# Patient Record
Sex: Male | Born: 2000 | Race: Black or African American | Hispanic: No | Marital: Single | Smoking: Never smoker
Health system: Southern US, Community
[De-identification: ages and names within clinical notes are randomized; demographics above are authoritative.]

## PROBLEM LIST (undated history)

## (undated) DIAGNOSIS — H9190 Unspecified hearing loss, unspecified ear: Secondary | ICD-10-CM

## (undated) DIAGNOSIS — H547 Unspecified visual loss: Secondary | ICD-10-CM

## (undated) DIAGNOSIS — G809 Cerebral palsy, unspecified: Secondary | ICD-10-CM

## (undated) DIAGNOSIS — IMO0001 Reserved for inherently not codable concepts without codable children: Secondary | ICD-10-CM

## (undated) DIAGNOSIS — R6250 Unspecified lack of expected normal physiological development in childhood: Secondary | ICD-10-CM

## (undated) HISTORY — PX: EXTRACORPOREAL CIRCULATION: SHX266

---
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
## 2020-01-04 ENCOUNTER — Ambulatory Visit: Payer: Medicaid Other

## 2020-01-12 ENCOUNTER — Ambulatory Visit: Payer: Medicaid Other

## 2020-01-20 ENCOUNTER — Other Ambulatory Visit: Payer: Self-pay

## 2020-01-20 ENCOUNTER — Ambulatory Visit (INDEPENDENT_AMBULATORY_CARE_PROVIDER_SITE_OTHER): Payer: Medicaid Other

## 2020-01-20 DIAGNOSIS — Z23 Encounter for immunization: Secondary | ICD-10-CM

## 2020-01-20 NOTE — Progress Notes (Signed)
   Covid-19 Vaccination Clinic  Name:  Gary Warren    MRN: [id number] DOB: [birth year]  01/20/2020  [name] was observed post Covid-19 immunization for 15 minutes without incident. He was provided with Vaccine Information Sheet and instruction to access the V-Safe system.   [name] was instructed to call 911 with any severe reactions post vaccine: Marland Kitchen Difficulty breathing  . Swelling of face and throat  . A fast heartbeat  . A bad rash all over body  . Dizziness and weakness   #2 Covid Vaccine administered RD without complication.

## 2020-01-21 ENCOUNTER — Ambulatory Visit: Payer: Medicaid Other

## 2020-10-18 ENCOUNTER — Inpatient Hospital Stay (HOSPITAL_COMMUNITY)
Admission: EM | Admit: 2020-10-18 | Discharge: 2021-02-06 | DRG: 922 | Disposition: A | Discharge status: Skilled Nursing Facility | Payer: Medicaid Other | Attending: Internal Medicine | Admitting: Internal Medicine

## 2020-10-18 ENCOUNTER — Emergency Department (HOSPITAL_COMMUNITY): Payer: Medicaid Other

## 2020-10-18 ENCOUNTER — Other Ambulatory Visit: Payer: Self-pay

## 2020-10-18 DIAGNOSIS — I10 Essential (primary) hypertension: Secondary | ICD-10-CM | POA: Diagnosis present

## 2020-10-18 DIAGNOSIS — E872 Acidosis, unspecified: Secondary | ICD-10-CM | POA: Diagnosis not present

## 2020-10-18 DIAGNOSIS — Z20822 Contact with and (suspected) exposure to covid-19: Secondary | ICD-10-CM | POA: Diagnosis present

## 2020-10-18 DIAGNOSIS — Z681 Body mass index (BMI) 19 or less, adult: Secondary | ICD-10-CM

## 2020-10-18 DIAGNOSIS — L8990 Pressure ulcer of unspecified site, unspecified stage: Secondary | ICD-10-CM | POA: Diagnosis present

## 2020-10-18 DIAGNOSIS — L210 Seborrhea capitis: Secondary | ICD-10-CM | POA: Diagnosis present

## 2020-10-18 DIAGNOSIS — D538 Other specified nutritional anemias: Secondary | ICD-10-CM | POA: Diagnosis present

## 2020-10-18 DIAGNOSIS — L89322 Pressure ulcer of left buttock, stage 2: Secondary | ICD-10-CM | POA: Diagnosis present

## 2020-10-18 DIAGNOSIS — E876 Hypokalemia: Secondary | ICD-10-CM | POA: Diagnosis present

## 2020-10-18 DIAGNOSIS — I9589 Other hypotension: Secondary | ICD-10-CM | POA: Diagnosis not present

## 2020-10-18 DIAGNOSIS — R1310 Dysphagia, unspecified: Secondary | ICD-10-CM

## 2020-10-18 DIAGNOSIS — R627 Adult failure to thrive: Secondary | ICD-10-CM | POA: Diagnosis present

## 2020-10-18 DIAGNOSIS — L89222 Pressure ulcer of left hip, stage 2: Secondary | ICD-10-CM | POA: Diagnosis present

## 2020-10-18 DIAGNOSIS — R6250 Unspecified lack of expected normal physiological development in childhood: Secondary | ICD-10-CM | POA: Diagnosis present

## 2020-10-18 DIAGNOSIS — K5649 Other impaction of intestine: Secondary | ICD-10-CM | POA: Diagnosis present

## 2020-10-18 DIAGNOSIS — R140 Abdominal distension (gaseous): Secondary | ICD-10-CM

## 2020-10-18 DIAGNOSIS — R6520 Severe sepsis without septic shock: Secondary | ICD-10-CM | POA: Diagnosis not present

## 2020-10-18 DIAGNOSIS — R5381 Other malaise: Secondary | ICD-10-CM | POA: Diagnosis present

## 2020-10-18 DIAGNOSIS — A419 Sepsis, unspecified organism: Secondary | ICD-10-CM | POA: Diagnosis not present

## 2020-10-18 DIAGNOSIS — K567 Ileus, unspecified: Secondary | ICD-10-CM

## 2020-10-18 DIAGNOSIS — D638 Anemia in other chronic diseases classified elsewhere: Secondary | ICD-10-CM | POA: Diagnosis present

## 2020-10-18 DIAGNOSIS — R64 Cachexia: Secondary | ICD-10-CM

## 2020-10-18 DIAGNOSIS — F79 Unspecified intellectual disabilities: Secondary | ICD-10-CM | POA: Diagnosis present

## 2020-10-18 DIAGNOSIS — K625 Hemorrhage of anus and rectum: Secondary | ICD-10-CM | POA: Diagnosis present

## 2020-10-18 DIAGNOSIS — T7401XA Adult neglect or abandonment, confirmed, initial encounter: Principal | ICD-10-CM | POA: Diagnosis present

## 2020-10-18 DIAGNOSIS — E860 Dehydration: Secondary | ICD-10-CM | POA: Diagnosis present

## 2020-10-18 DIAGNOSIS — L89312 Pressure ulcer of right buttock, stage 2: Secondary | ICD-10-CM | POA: Diagnosis present

## 2020-10-18 DIAGNOSIS — G809 Cerebral palsy, unspecified: Secondary | ICD-10-CM | POA: Diagnosis present

## 2020-10-18 DIAGNOSIS — E43 Unspecified severe protein-calorie malnutrition: Secondary | ICD-10-CM | POA: Diagnosis present

## 2020-10-18 DIAGNOSIS — Z4659 Encounter for fitting and adjustment of other gastrointestinal appliance and device: Secondary | ICD-10-CM

## 2020-10-18 DIAGNOSIS — L89622 Pressure ulcer of left heel, stage 2: Secondary | ICD-10-CM | POA: Diagnosis present

## 2020-10-18 DIAGNOSIS — K5641 Fecal impaction: Secondary | ICD-10-CM | POA: Diagnosis present

## 2020-10-18 DIAGNOSIS — R634 Abnormal weight loss: Secondary | ICD-10-CM

## 2020-10-18 DIAGNOSIS — K5900 Constipation, unspecified: Secondary | ICD-10-CM

## 2020-10-18 DIAGNOSIS — Z515 Encounter for palliative care: Secondary | ICD-10-CM | POA: Diagnosis not present

## 2020-10-18 LAB — COMPREHENSIVE METABOLIC PANEL
ALT: 21 U/L (ref 0–44)
AST: 28 U/L (ref 15–41)
Albumin: 3.7 g/dL (ref 3.5–5.0)
Alkaline Phosphatase: 54 U/L (ref 38–126)
Anion gap: 10 (ref 5–15)
BUN: 9 mg/dL (ref 6–20)
CO2: 23 mmol/L (ref 22–32)
Calcium: 9.1 mg/dL (ref 8.9–10.3)
Chloride: 104 mmol/L (ref 98–111)
Creatinine, Ser: 0.33 mg/dL — ABNORMAL LOW (ref 0.61–1.24)
GFR, Estimated: >60 mL/min (ref >60)
Glucose, Bld: 97 mg/dL (ref 70–99)
Potassium: 4.4 mmol/L (ref 3.5–5.1)
Sodium: 137 mmol/L (ref 135–145)
Total Bilirubin: 0.3 mg/dL (ref 0.3–1.2)
Total Protein: 6.5 g/dL (ref 6.5–8.1)

## 2020-10-18 LAB — CBC WITH DIFFERENTIAL/PLATELET
Abs Immature Granulocytes: 0.02 10*3/uL (ref 0.00–0.07)
Basophils Absolute: 0 10*3/uL (ref 0.0–0.1)
Basophils Relative: 1 %
Eosinophils Absolute: 0 10*3/uL (ref 0.0–0.5)
Eosinophils Relative: 0 %
HCT: 36.6 % — ABNORMAL LOW (ref 39.0–52.0)
Hemoglobin: 12.2 g/dL — ABNORMAL LOW (ref 13.0–17.0)
Immature Granulocytes: 1 %
Lymphocytes Relative: 37 %
Lymphs Abs: 1.3 10*3/uL (ref 0.7–4.0)
MCH: 27.5 pg (ref 26.0–34.0)
MCHC: 33.3 g/dL (ref 30.0–36.0)
MCV: 82.4 fL (ref 80.0–100.0)
Monocytes Absolute: 0.2 10*3/uL (ref 0.1–1.0)
Monocytes Relative: 6 %
Neutro Abs: 2 10*3/uL (ref 1.7–7.7)
Neutrophils Relative %: 55 %
Platelets: 236 10*3/uL (ref 150–400)
RBC: 4.44 MIL/uL (ref 4.22–5.81)
RDW: 14.7 % (ref 11.5–15.5)
WBC: 3.6 10*3/uL — ABNORMAL LOW (ref 4.0–10.5)
nRBC: 0 % (ref 0.0–0.2)

## 2020-10-18 LAB — RESP PANEL BY RT-PCR (FLU A&B, COVID) ARPGX2
Influenza A by PCR: NEGATIVE
Influenza B by PCR: NEGATIVE
SARS Coronavirus 2 by RT PCR: NEGATIVE

## 2020-10-18 LAB — ETHANOL: Alcohol, Ethyl (B): <10 mg/dL (ref <10)

## 2020-10-18 MED ORDER — ACETAMINOPHEN 325 MG PO TABS
325.0000 mg | ORAL_TABLET | Freq: Four times a day (QID) | ORAL | Status: DC | PRN
  Administered 2020-11-30: 325 mg via ORAL
  Filled 2020-10-18 (×4): qty 1

## 2020-10-18 MED ORDER — IOHEXOL 350 MG/ML SOLN
40.0000 mL | Freq: Once | INTRAVENOUS | Status: AC | PRN
  Administered 2020-10-18: 40 mL via INTRAVENOUS

## 2020-10-18 MED ORDER — HEPARIN SODIUM (PORCINE) 5000 UNIT/ML IJ SOLN
5000.0000 [IU] | Freq: Two times a day (BID) | INTRAMUSCULAR | Status: DC
  Administered 2020-10-19 – 2020-11-05 (×35): 5000 [IU] via SUBCUTANEOUS
  Filled 2020-10-18 (×33): qty 1

## 2020-10-18 MED ORDER — BARRIER CREAM NON-SPECIFIED: 1.0000 "application " | TOPICAL_CREAM | Freq: Two times a day (BID) | TOPICAL | Status: DC | PRN

## 2020-10-18 MED ORDER — ONDANSETRON HCL 4 MG/2ML IJ SOLN: 4.0000 mg | Freq: Four times a day (QID) | INTRAMUSCULAR | Status: DC | PRN

## 2020-10-18 MED ORDER — SORBITOL 70 % SOLN
200.0000 mL | TOPICAL_OIL | Freq: Once | ORAL | Status: AC
  Administered 2020-10-18: 200 mL via RECTAL
  Filled 2020-10-18: qty 60

## 2020-10-18 MED ORDER — SORBITOL 70 % SOLN: 200.0000 mL | TOPICAL_OIL | Freq: Once | ORAL | Status: DC | PRN

## 2020-10-18 MED ORDER — ONDANSETRON HCL 4 MG PO TABS: 4.0000 mg | ORAL_TABLET | Freq: Four times a day (QID) | ORAL | Status: DC | PRN

## 2020-10-18 MED ORDER — KCL IN DEXTROSE-NACL 10-5-0.45 MEQ/L-%-% IV SOLN
INTRAVENOUS | Status: AC
  Filled 2020-10-18: qty 1000

## 2020-10-18 MED ORDER — ACETAMINOPHEN 650 MG RE SUPP
650.0000 mg | Freq: Four times a day (QID) | RECTAL | Status: DC | PRN
  Administered 2020-10-21: 650 mg via RECTAL
  Filled 2020-10-18: qty 1

## 2020-10-18 NOTE — ED Notes (Signed)
Received pt, alert, looking around the room, no acute distress noted. Appears underweight, cheekbone prominent. Non verbal but makes sounds which per EMS was normal for patient. Clean clothes on and clean shoes noted. Long nails with black/yellow dirt noted under nails.

## 2020-10-18 NOTE — ED Notes (Signed)
Family updated as to patient's status.
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
## 2020-10-18 NOTE — ED Notes (Signed)
Andreda mother [phone] would like an update on the patient
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
## 2020-10-18 NOTE — ED Provider Notes (Signed)
Care of the patient assumed at the change of shift. Patient with severe CP brought from [facility] center for concerns of significant weight loss in the last 6-8 months. CPS referral was made.  Physical Exam  BP 107/71   Pulse 88   Resp 16   Wt 22.5 kg   SpO2 100%   Physical Exam Cachectic Large hard stool ball, brown stools  ED Course/Procedures   Clinical Course as of 10/19/20 0833  Wed Oct 18, 2020  1434 I discussed the chest x-ray results with the radiologist who read the film.  He is concerned about free air in the abdomen.  He is also concerned about potential sigmoid colon dilation.  CT scan of the abdomen pelvis is ordered. [EW]  1509 [phone], Myrtis Ser. Adult Protective Services. [EW]  1532 There are 2 caseworkers in the room with the patient and they are concerned about his "emaciation and malnourishment."  They state that they do not have indication to remove him from the house yet, but would like to see him admitted for management of the malnutrition.  I explained that the evaluation was in process with CT imaging ordered.  At this point the available blood work is reassuring and does not indicate acute problems. [EW]  1654 Large hard stool ball, CT images reviewed, will discuss with GI [CS]  1756 Spoke with Dr. Rhea Belton, GI who recommends trying a SMOG enema to help soften the stool ball, admit to medicine and they will consult. Unassigned admission paged.  [CS]  1831 Spoke with Dr. Terrilee Croak, Hospitalist, who will evaluate for admission.  [CS]    Clinical Course User Index [CS] Pollyann Savoy, MD [EW] Mancel Bale, MD    Procedures  MDM         Pollyann Savoy, MD 10/19/20 [phone]

## 2020-10-18 NOTE — Progress Notes (Signed)
APS is present in the room. SW plans on going to the mothers job to make her aware of the report and her son being in the hospital. APS at this time does not feel comfortable with patient returning back to the mothers home. Patient teacher stated they have not seen him since last year and he was walking and eating on his own. CSW was told that they had to use a wheelchair for patient because he was too weak to walk. There were also concerns that he could not feed himself today at school.
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
## 2020-10-18 NOTE — ED Notes (Signed)
Secretary ordered enema kit. 

## 2020-10-18 NOTE — ED Notes (Signed)
Attempted to place male purewick for urine collection, unsuccessful. Diaper changed and repositioned.

## 2020-10-19 ENCOUNTER — Inpatient Hospital Stay (HOSPITAL_COMMUNITY): Payer: Medicaid Other

## 2020-10-19 DIAGNOSIS — L8990 Pressure ulcer of unspecified site, unspecified stage: Secondary | ICD-10-CM | POA: Diagnosis present

## 2020-10-19 DIAGNOSIS — K5649 Other impaction of intestine: Secondary | ICD-10-CM | POA: Diagnosis not present

## 2020-10-19 LAB — CBC
HCT: 46 % (ref 39.0–52.0)
Hemoglobin: 15.5 g/dL (ref 13.0–17.0)
MCH: 27 pg (ref 26.0–34.0)
MCHC: 33.7 g/dL (ref 30.0–36.0)
MCV: 80 fL (ref 80.0–100.0)
Platelets: 321 10*3/uL (ref 150–400)
RBC: 5.75 MIL/uL (ref 4.22–5.81)
RDW: 14.2 % (ref 11.5–15.5)
WBC: 3.8 10*3/uL — ABNORMAL LOW (ref 4.0–10.5)
nRBC: 0 % (ref 0.0–0.2)

## 2020-10-19 LAB — MAGNESIUM: Magnesium: 2.3 mg/dL (ref 1.7–2.4)

## 2020-10-19 LAB — PHOSPHORUS: Phosphorus: 3.4 mg/dL (ref 2.5–4.6)

## 2020-10-19 LAB — BASIC METABOLIC PANEL
Anion gap: 10 (ref 5–15)
BUN: 7 mg/dL (ref 6–20)
CO2: 25 mmol/L (ref 22–32)
Calcium: 9.5 mg/dL (ref 8.9–10.3)
Chloride: 99 mmol/L (ref 98–111)
Creatinine, Ser: 0.43 mg/dL — ABNORMAL LOW (ref 0.61–1.24)
GFR, Estimated: >60 mL/min (ref >60)
Glucose, Bld: 130 mg/dL — ABNORMAL HIGH (ref 70–99)
Potassium: 4.5 mmol/L (ref 3.5–5.1)
Sodium: 134 mmol/L — ABNORMAL LOW (ref 135–145)

## 2020-10-19 LAB — HIV ANTIBODY (ROUTINE TESTING W REFLEX): HIV Screen 4th Generation wRfx: NONREACTIVE

## 2020-10-19 MED ORDER — LIDOCAINE VISCOUS HCL 2 % MT SOLN
3.0000 mL | Freq: Once | OROMUCOSAL | Status: AC
  Administered 2020-10-19: 3 mL via OROMUCOSAL
  Filled 2020-10-19: qty 5

## 2020-10-19 MED ORDER — KCL IN DEXTROSE-NACL 10-5-0.45 MEQ/L-%-% IV SOLN
INTRAVENOUS | Status: DC
  Filled 2020-10-19 (×4): qty 1000

## 2020-10-19 MED ORDER — SORBITOL 70 % SOLN
960.0000 mL | TOPICAL_OIL | Freq: Once | ORAL | Status: DC
  Filled 2020-10-19: qty 473

## 2020-10-19 MED ORDER — PEG 3350-KCL-NA BICARB-NACL 420 G PO SOLR
4000.0000 mL | Freq: Once | ORAL | Status: DC
  Filled 2020-10-19: qty 4000

## 2020-10-19 MED ORDER — KCL IN DEXTROSE-NACL 10-5-0.45 MEQ/L-%-% IV SOLN: INTRAVENOUS | Status: DC

## 2020-10-19 MED ORDER — POLYETHYLENE GLYCOL 3350 17 GM/SCOOP PO POWD
1.0000 | Freq: Two times a day (BID) | ORAL | Status: AC
  Administered 2020-10-19: 255 g via ORAL
  Filled 2020-10-19 (×2): qty 255

## 2020-10-19 MED ORDER — SORBITOL 70 % SOLN
960.0000 mL | TOPICAL_OIL | Freq: Once | ORAL | Status: AC
  Administered 2020-10-19: 400 mL via RECTAL
  Filled 2020-10-19: qty 473

## 2020-10-19 NOTE — Progress Notes (Signed)
Patient had 2 large bowel movements. Patient was cleaned and made comfortable.
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
## 2020-10-19 NOTE — ED Notes (Signed)
Manually disimpacted large amount of hard formed stools at rectal vault , repositioned on bed , brief applied , comfort measures rendered.

## 2020-10-19 NOTE — Progress Notes (Signed)
RN went to give miralax to patient and found nasogastric tube on the bed. Patient still has his bilateral safety mittens on. [place],MD notified via secure chat. No new order.

## 2020-10-19 NOTE — Progress Notes (Signed)
New Admission Note:   Arrival Method: Arrived from [facility] ED via stretcher Mental Orientation: Alert,non verbal Telemetry: N/A Assessment: Completed Skin: Pls see doc flowsheet IV: Rt FA, Lt FA Pain: 0/10 Tubes: N/A Safety Measures: Safety Fall Prevention Plan has been discussed.  Admission: Not completed,patient is nonverbal,hx MR Orientation: Patient has been oriented to the room, unit and staff.  Family: None  Orders have been reviewed and implemented. Will continue to monitor the patient. Call light has been placed within reach and bed alarm has been activated.   Bralin Garry [employer], RN-BC Phone number: [phone]

## 2020-10-19 NOTE — Progress Notes (Signed)
Received report from [place].RN. Room ready for patient.

## 2020-10-19 NOTE — Plan of Care (Signed)
  Problem: Nutrition: Goal: Adequate nutrition will be maintained Outcome: Not Progressing   Problem: Elimination: Goal: Will not experience complications related to bowel motility Outcome: Not Progressing   

## 2020-10-20 ENCOUNTER — Inpatient Hospital Stay (HOSPITAL_COMMUNITY): Payer: Medicaid Other

## 2020-10-20 ENCOUNTER — Inpatient Hospital Stay: Payer: Self-pay

## 2020-10-20 DIAGNOSIS — K5649 Other impaction of intestine: Secondary | ICD-10-CM | POA: Diagnosis not present

## 2020-10-20 LAB — BASIC METABOLIC PANEL
Anion gap: 7 (ref 5–15)
BUN: 7 mg/dL (ref 6–20)
CO2: 22 mmol/L (ref 22–32)
Calcium: 8.7 mg/dL — ABNORMAL LOW (ref 8.9–10.3)
Chloride: 102 mmol/L (ref 98–111)
Creatinine, Ser: 0.38 mg/dL — ABNORMAL LOW (ref 0.61–1.24)
GFR, Estimated: >60 mL/min (ref >60)
Glucose, Bld: 86 mg/dL (ref 70–99)
Potassium: 4.2 mmol/L (ref 3.5–5.1)
Sodium: 131 mmol/L — ABNORMAL LOW (ref 135–145)

## 2020-10-20 LAB — PHOSPHORUS: Phosphorus: 3.5 mg/dL (ref 2.5–4.6)

## 2020-10-20 LAB — MAGNESIUM: Magnesium: 2.1 mg/dL (ref 1.7–2.4)

## 2020-10-20 MED ORDER — ADULT MULTIVITAMIN LIQUID CH
15.0000 mL | Freq: Every day | ORAL | Status: AC
  Administered 2020-10-20 – 2020-10-22 (×3): 15 mL via ORAL
  Filled 2020-10-20 (×4): qty 15
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
## 2020-10-20 NOTE — TOC Progression Note (Signed)
Transition of Care [facility]) - Progression Note    Patient Details  Name: KAYN HAYMORE MRN: [id number] Date of Birth: 2000/03/11  Transition of [facility]) CM/SW Contact  Okey Dupre Lazaro Arms, LCSW Phone Number: 10/20/2020, 4:52 PM  Clinical Narrative:  On 9/1, CSW received a call from Adult Management consultant (APS) social worker Ignacia Bayley. She saw patient at the hospital on 8/31 and can be contacted at [phone].      Expected Discharge Plan and Services  To be determined                                               Social Determinants of Health (SDOH) Interventions  Patient malnourished  Readmission Risk Interventions No flowsheet data found.

## 2020-10-20 NOTE — Progress Notes (Signed)
[place] [facility] Progress Note    Since last GI note: Laying in bed with mits on. Opens eyes, otherwise no real reactions.  Objective: Vital signs in last 24 hours: Temp:  [97.7 F (36.5 C)-98 F (36.7 C)] 97.8 F (36.6 C) (09/02 0434) Pulse Rate:  [91-110] 94 (09/02 0434) Resp:  [15-18] 17 (09/02 0434) BP: (95-112)/(49-75) 97/73 (09/02 0434) SpO2:  [98 %-99 %] 98 % (09/02 0434) Last BM Date: 10/19/20 General: alert and oriented times 0 Heart: regular rate and rythm Abdomen: extremely cachectic, no obvious abd tenderness   Lab Results: Recent Labs    10/18/20 1244 10/19/20 0723  WBC 3.6* 3.8*  HGB 12.2* 15.5  PLT 236 321  MCV 82.4 80.0   Recent Labs    10/18/20 1244 10/19/20 0723 10/20/20 0557  NA 137 134* 131*  K 4.4 4.5 4.2  CL 104 99 102  CO2 23 25 22   GLUCOSE 97 130* 86  BUN 9 7 7   CREATININE 0.33* 0.43* 0.38*  CALCIUM 9.1 9.5 8.7*   Recent Labs    10/18/20 1244  PROT 6.5  ALBUMIN 3.7  AST 28  ALT 21  ALKPHOS 54  BILITOT 0.3   Medications: Scheduled Meds:  heparin  5,000 Units Subcutaneous BID   polyethylene glycol powder  1 Container Oral Q12H   sorbitol, milk of mag, mineral oil, glycerin (SMOG) enema  960 mL Rectal Once   Continuous Infusions:  dextrose 5 % and 0.45 % NaCl with KCl 10 mEq/L 50 mL/hr at 10/19/20 1314   PRN Meds:.acetaminophen **OR** acetaminophen, barrier cream, ondansetron **OR** ondansetron (ZOFRAN) IV    Assessment/Plan: 20 y.o. male with extreme obstipation, malnourishment, withering  I am getting a kub this AM to check for evidence of persistent large volume stool in his colon. If this is confirmed then he will need another NG tube placed and at least 1/2 prep with golytely infused.  He is not able to feed himself. Once we are convinced his obstipation has cleared then nutritional status will need to be addressed.   Will follow along.  12/19/20, MD  10/20/2020, 7:59 AM [place]  [facility] Pager [phone]

## 2020-10-20 NOTE — Progress Notes (Signed)
KUB shows stool burden is much much improved.  I suggest getting further care recommendations from whomever is able to make his medical decisions.  Probably most important is providing nutrition.  If it is decided that he should have nutrition via a feeding tube, please contact IR about placing one.  I recommend miralax 17gm powder once daily orally to help prevent future constipation.  I am signing off for now but am available for any further questions or concerns.
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
## 2020-10-21 DIAGNOSIS — K5649 Other impaction of intestine: Secondary | ICD-10-CM | POA: Diagnosis not present

## 2020-10-21 DIAGNOSIS — E43 Unspecified severe protein-calorie malnutrition: Secondary | ICD-10-CM | POA: Diagnosis present

## 2020-10-21 LAB — BASIC METABOLIC PANEL
Anion gap: 6 (ref 5–15)
BUN: 6 mg/dL (ref 6–20)
CO2: 24 mmol/L (ref 22–32)
Calcium: 8.8 mg/dL — ABNORMAL LOW (ref 8.9–10.3)
Chloride: 104 mmol/L (ref 98–111)
Creatinine, Ser: 0.49 mg/dL — ABNORMAL LOW (ref 0.61–1.24)
GFR, Estimated: >60 mL/min (ref >60)
Glucose, Bld: 80 mg/dL (ref 70–99)
Potassium: 4.8 mmol/L (ref 3.5–5.1)
Sodium: 134 mmol/L — ABNORMAL LOW (ref 135–145)

## 2020-10-21 MED ORDER — SODIUM CHLORIDE 0.9% FLUSH
10.0000 mL | INTRAVENOUS | Status: DC | PRN
  Administered 2020-10-27: 20 mL

## 2020-10-21 MED ORDER — CHLORHEXIDINE GLUCONATE CLOTH 2 % EX PADS
6.0000 | MEDICATED_PAD | Freq: Every day | CUTANEOUS | Status: DC
  Administered 2020-10-21 – 2020-10-31 (×11): 6 via TOPICAL

## 2020-10-21 MED ORDER — DEXTROSE-NACL 5-0.45 % IV SOLN: INTRAVENOUS | Status: AC

## 2020-10-21 NOTE — Progress Notes (Signed)
CHG bath done, tubing changed before PICC line use. PIV removed.

## 2020-10-21 NOTE — Progress Notes (Signed)
Peripherally Inserted Central Catheter Placement  The IV Nurse has discussed with the patient and/or persons authorized to consent for the patient, the purpose of this procedure and the potential benefits and risks involved with this procedure.  The benefits include less needle sticks, lab draws from the catheter, and the patient may be discharged home with the catheter. Risks include, but not limited to, infection, bleeding, blood clot (thrombus formation), and puncture of an artery; nerve damage and irregular heartbeat and possibility to perform a PICC exchange if needed/ordered by physician.  Alternatives to this procedure were also discussed.  Bard Power PICC patient education guide, fact sheet on infection prevention and patient information card has been provided to patient /or left at bedside.    PICC Placement Documentation  PICC Double Lumen 10/21/20 PICC Left Brachial 31 cm 0 cm (Active)  Indication for Insertion or Continuance of Line Administration of hyperosmolar/irritating solutions (i.e. TPN, Vancomycin, etc.) 10/21/20 1310  Exposed Catheter (cm) 0 cm 10/21/20 1310  Site Assessment Clean;Dry;Intact 10/21/20 1310  Lumen #1 Status Flushed;Saline locked;Blood return noted 10/21/20 1310  Lumen #2 Status Flushed;Saline locked;Blood return noted 10/21/20 1310  Dressing Type Transparent 10/21/20 1310  Dressing Status Clean;Dry;Intact 10/21/20 1310  Antimicrobial disc in place? Yes 10/21/20 1310  Safety Lock Not Applicable 10/21/20 1310  Line Care Connections checked and tightened 10/21/20 1310  Line Adjustment (NICU/IV Team Only) No 10/21/20 1310  Dressing Intervention New dressing 10/21/20 1310  Dressing Change Due 10/28/20 10/21/20 1310       Elliot Dally 10/21/2020, 1:11 PM

## 2020-10-21 NOTE — Progress Notes (Signed)
Juliet RN notified via securechat PICC is in LA and ready to use and to remove PIV  and change tubing.

## 2020-10-21 NOTE — Progress Notes (Signed)
Verbal consent gotten from [name] mother for insertion of PICC on son Gary Warren at 10:19 am on 10/21/20. Witness was Radio broadcast assistant, Charity fundraiser. PICC RN informed.
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
## 2020-10-22 DIAGNOSIS — K5649 Other impaction of intestine: Secondary | ICD-10-CM | POA: Diagnosis not present

## 2020-10-22 LAB — COMPREHENSIVE METABOLIC PANEL
ALT: 48 U/L — ABNORMAL HIGH (ref 0–44)
AST: 51 U/L — ABNORMAL HIGH (ref 15–41)
Albumin: 3.1 g/dL — ABNORMAL LOW (ref 3.5–5.0)
Alkaline Phosphatase: 54 U/L (ref 38–126)
Anion gap: 6 (ref 5–15)
BUN: <5 mg/dL — ABNORMAL LOW (ref 6–20)
CO2: 25 mmol/L (ref 22–32)
Calcium: 8.5 mg/dL — ABNORMAL LOW (ref 8.9–10.3)
Chloride: 104 mmol/L (ref 98–111)
Creatinine, Ser: <0.3 mg/dL — ABNORMAL LOW (ref 0.61–1.24)
Glucose, Bld: 75 mg/dL (ref 70–99)
Potassium: 3.4 mmol/L — ABNORMAL LOW (ref 3.5–5.1)
Sodium: 135 mmol/L (ref 135–145)
Total Bilirubin: 0.5 mg/dL (ref 0.3–1.2)
Total Protein: 5.4 g/dL — ABNORMAL LOW (ref 6.5–8.1)

## 2020-10-22 LAB — TRIGLYCERIDES: Triglycerides: 70 mg/dL (ref <150)

## 2020-10-22 LAB — RENAL FUNCTION PANEL
Albumin: 3 g/dL — ABNORMAL LOW (ref 3.5–5.0)
Anion gap: 5 (ref 5–15)
BUN: <5 mg/dL — ABNORMAL LOW (ref 6–20)
CO2: 25 mmol/L (ref 22–32)
Calcium: 8.5 mg/dL — ABNORMAL LOW (ref 8.9–10.3)
Chloride: 105 mmol/L (ref 98–111)
Creatinine, Ser: 0.31 mg/dL — ABNORMAL LOW (ref 0.61–1.24)
GFR, Estimated: >60 mL/min (ref >60)
Glucose, Bld: 76 mg/dL (ref 70–99)
Phosphorus: 3.1 mg/dL (ref 2.5–4.6)
Potassium: 3.4 mmol/L — ABNORMAL LOW (ref 3.5–5.1)
Sodium: 135 mmol/L (ref 135–145)

## 2020-10-22 LAB — GLUCOSE, CAPILLARY
Glucose-Capillary: 78 mg/dL (ref 70–99)
Glucose-Capillary: 85 mg/dL (ref 70–99)

## 2020-10-22 LAB — PHOSPHORUS: Phosphorus: 3.1 mg/dL (ref 2.5–4.6)

## 2020-10-22 LAB — MAGNESIUM: Magnesium: 1.8 mg/dL (ref 1.7–2.4)

## 2020-10-22 MED ORDER — FAT EMUL FISH OIL/PLANT BASED 20% (SMOFLIPID)IV EMUL
INTRAVENOUS | Status: AC
Start: 2020-10-22 — End: 2020-10-23
  Filled 2020-10-22: qty 168

## 2020-10-22 MED ORDER — DEXTROSE-NACL 5-0.45 % IV SOLN: INTRAVENOUS | Status: AC

## 2020-10-22 MED ORDER — MAGNESIUM SULFATE 2 GM/50ML IV SOLN
2.0000 g | Freq: Once | INTRAVENOUS | Status: AC
  Administered 2020-10-22: 2 g via INTRAVENOUS
  Filled 2020-10-22: qty 50

## 2020-10-22 MED ORDER — INSULIN ASPART 100 UNIT/ML IJ SOLN: 0.0000 [IU] | Freq: Four times a day (QID) | INTRAMUSCULAR | Status: DC

## 2020-10-22 MED ORDER — POTASSIUM CHLORIDE 10 MEQ/50ML IV SOLN
10.0000 meq | INTRAVENOUS | Status: AC
  Administered 2020-10-22 (×6): 10 meq via INTRAVENOUS
  Filled 2020-10-22 (×6): qty 50
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
## 2020-10-23 DIAGNOSIS — K5649 Other impaction of intestine: Secondary | ICD-10-CM | POA: Diagnosis not present

## 2020-10-23 LAB — COMPREHENSIVE METABOLIC PANEL
ALT: 176 U/L — ABNORMAL HIGH (ref 0–44)
AST: 245 U/L — ABNORMAL HIGH (ref 15–41)
Albumin: 3.1 g/dL — ABNORMAL LOW (ref 3.5–5.0)
Alkaline Phosphatase: 73 U/L (ref 38–126)
Anion gap: 9 (ref 5–15)
BUN: <5 mg/dL — ABNORMAL LOW (ref 6–20)
CO2: 27 mmol/L (ref 22–32)
Calcium: 9.1 mg/dL (ref 8.9–10.3)
Chloride: 101 mmol/L (ref 98–111)
Creatinine, Ser: 0.3 mg/dL — ABNORMAL LOW (ref 0.61–1.24)
GFR, Estimated: >60 mL/min (ref >60)
Glucose, Bld: 84 mg/dL (ref 70–99)
Potassium: 4.2 mmol/L (ref 3.5–5.1)
Sodium: 137 mmol/L (ref 135–145)
Total Bilirubin: 0.6 mg/dL (ref 0.3–1.2)
Total Protein: 5.7 g/dL — ABNORMAL LOW (ref 6.5–8.1)

## 2020-10-23 LAB — RENAL FUNCTION PANEL
Albumin: 3.1 g/dL — ABNORMAL LOW (ref 3.5–5.0)
Anion gap: 7 (ref 5–15)
BUN: <5 mg/dL — ABNORMAL LOW (ref 6–20)
CO2: 27 mmol/L (ref 22–32)
Calcium: 9.1 mg/dL (ref 8.9–10.3)
Chloride: 102 mmol/L (ref 98–111)
Creatinine, Ser: <0.3 mg/dL — ABNORMAL LOW (ref 0.61–1.24)
Glucose, Bld: 86 mg/dL (ref 70–99)
Phosphorus: 3.3 mg/dL (ref 2.5–4.6)
Potassium: 4.2 mmol/L (ref 3.5–5.1)
Sodium: 136 mmol/L (ref 135–145)

## 2020-10-23 LAB — TRIGLYCERIDES: Triglycerides: 77 mg/dL (ref <150)

## 2020-10-23 LAB — PHOSPHORUS: Phosphorus: 3.4 mg/dL (ref 2.5–4.6)

## 2020-10-23 LAB — GLUCOSE, CAPILLARY
Glucose-Capillary: 71 mg/dL (ref 70–99)
Glucose-Capillary: 92 mg/dL (ref 70–99)
Glucose-Capillary: 93 mg/dL (ref 70–99)

## 2020-10-23 LAB — MAGNESIUM: Magnesium: 2.1 mg/dL (ref 1.7–2.4)

## 2020-10-23 MED ORDER — TRAVASOL 10 % IV SOLN
INTRAVENOUS | Status: AC
  Filled 2020-10-23: qty 336

## 2020-10-23 MED ORDER — DEXTROSE-NACL 5-0.45 % IV SOLN: INTRAVENOUS | Status: DC
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
## 2020-10-24 DIAGNOSIS — K5649 Other impaction of intestine: Secondary | ICD-10-CM | POA: Diagnosis not present

## 2020-10-24 LAB — BASIC METABOLIC PANEL
Anion gap: 7 (ref 5–15)
BUN: 7 mg/dL (ref 6–20)
CO2: 27 mmol/L (ref 22–32)
Calcium: 8.8 mg/dL — ABNORMAL LOW (ref 8.9–10.3)
Chloride: 102 mmol/L (ref 98–111)
Creatinine, Ser: 0.3 mg/dL — ABNORMAL LOW (ref 0.61–1.24)
GFR, Estimated: >60 mL/min (ref >60)
Glucose, Bld: 87 mg/dL (ref 70–99)
Potassium: 4 mmol/L (ref 3.5–5.1)
Sodium: 136 mmol/L (ref 135–145)

## 2020-10-24 LAB — GLUCOSE, CAPILLARY
Glucose-Capillary: 100 mg/dL — ABNORMAL HIGH (ref 70–99)
Glucose-Capillary: 103 mg/dL — ABNORMAL HIGH (ref 70–99)
Glucose-Capillary: 114 mg/dL — ABNORMAL HIGH (ref 70–99)
Glucose-Capillary: 92 mg/dL (ref 70–99)
Glucose-Capillary: 92 mg/dL (ref 70–99)

## 2020-10-24 LAB — MAGNESIUM: Magnesium: 1.9 mg/dL (ref 1.7–2.4)

## 2020-10-24 LAB — PHOSPHORUS: Phosphorus: 3.7 mg/dL (ref 2.5–4.6)

## 2020-10-24 MED ORDER — TRAVASOL 10 % IV SOLN
INTRAVENOUS | Status: AC
  Filled 2020-10-24: qty 546

## 2020-10-24 MED ORDER — SODIUM CHLORIDE 0.9 % IV SOLN: INTRAVENOUS | Status: DC
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
## 2020-10-25 ENCOUNTER — Encounter (HOSPITAL_COMMUNITY): Payer: Self-pay | Admitting: Internal Medicine

## 2020-10-25 ENCOUNTER — Inpatient Hospital Stay (HOSPITAL_COMMUNITY): Payer: Medicaid Other

## 2020-10-25 ENCOUNTER — Other Ambulatory Visit: Payer: Self-pay

## 2020-10-25 DIAGNOSIS — K5649 Other impaction of intestine: Secondary | ICD-10-CM | POA: Diagnosis not present

## 2020-10-25 LAB — HEPATIC FUNCTION PANEL
ALT: 78 U/L — ABNORMAL HIGH (ref 0–44)
AST: 44 U/L — ABNORMAL HIGH (ref 15–41)
Albumin: 3.1 g/dL — ABNORMAL LOW (ref 3.5–5.0)
Alkaline Phosphatase: 62 U/L (ref 38–126)
Bilirubin, Direct: <0.1 mg/dL (ref 0.0–0.2)
Total Bilirubin: 0.3 mg/dL (ref 0.3–1.2)
Total Protein: 5.2 g/dL — ABNORMAL LOW (ref 6.5–8.1)

## 2020-10-25 LAB — BASIC METABOLIC PANEL
Anion gap: 5 (ref 5–15)
BUN: 11 mg/dL (ref 6–20)
CO2: 27 mmol/L (ref 22–32)
Calcium: 9 mg/dL (ref 8.9–10.3)
Chloride: 106 mmol/L (ref 98–111)
Creatinine, Ser: <0.3 mg/dL — ABNORMAL LOW (ref 0.61–1.24)
Glucose, Bld: 91 mg/dL (ref 70–99)
Potassium: 3.9 mmol/L (ref 3.5–5.1)
Sodium: 138 mmol/L (ref 135–145)

## 2020-10-25 LAB — GLUCOSE, CAPILLARY
Glucose-Capillary: 98 mg/dL (ref 70–99)
Glucose-Capillary: 107 mg/dL — ABNORMAL HIGH (ref 70–99)
Glucose-Capillary: 83 mg/dL (ref 70–99)

## 2020-10-25 LAB — MAGNESIUM: Magnesium: 1.8 mg/dL (ref 1.7–2.4)

## 2020-10-25 LAB — PHOSPHORUS: Phosphorus: 4.2 mg/dL (ref 2.5–4.6)

## 2020-10-25 MED ORDER — TRAVASOL 10 % IV SOLN
INTRAVENOUS | Status: AC
  Filled 2020-10-25: qty 546

## 2020-10-25 MED ORDER — SODIUM CHLORIDE 0.9 % IV BOLUS
250.0000 mL | Freq: Once | INTRAVENOUS | Status: AC
  Administered 2020-10-25: 250 mL via INTRAVENOUS
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
## 2020-10-26 ENCOUNTER — Inpatient Hospital Stay (HOSPITAL_COMMUNITY): Payer: Medicaid Other

## 2020-10-26 DIAGNOSIS — G809 Cerebral palsy, unspecified: Secondary | ICD-10-CM

## 2020-10-26 DIAGNOSIS — R634 Abnormal weight loss: Secondary | ICD-10-CM

## 2020-10-26 DIAGNOSIS — K5649 Other impaction of intestine: Secondary | ICD-10-CM

## 2020-10-26 DIAGNOSIS — E43 Unspecified severe protein-calorie malnutrition: Secondary | ICD-10-CM

## 2020-10-26 DIAGNOSIS — R1310 Dysphagia, unspecified: Secondary | ICD-10-CM

## 2020-10-26 DIAGNOSIS — Z515 Encounter for palliative care: Secondary | ICD-10-CM

## 2020-10-26 DIAGNOSIS — R64 Cachexia: Secondary | ICD-10-CM

## 2020-10-26 LAB — COMPREHENSIVE METABOLIC PANEL
ALT: 65 U/L — ABNORMAL HIGH (ref 0–44)
AST: 34 U/L (ref 15–41)
Albumin: 3.4 g/dL — ABNORMAL LOW (ref 3.5–5.0)
Alkaline Phosphatase: 64 U/L (ref 38–126)
Anion gap: 4 — ABNORMAL LOW (ref 5–15)
BUN: 11 mg/dL (ref 6–20)
CO2: 26 mmol/L (ref 22–32)
Calcium: 9.1 mg/dL (ref 8.9–10.3)
Chloride: 106 mmol/L (ref 98–111)
Creatinine, Ser: <0.3 mg/dL — ABNORMAL LOW (ref 0.61–1.24)
Glucose, Bld: 94 mg/dL (ref 70–99)
Potassium: 4.1 mmol/L (ref 3.5–5.1)
Sodium: 136 mmol/L (ref 135–145)
Total Bilirubin: 0.2 mg/dL — ABNORMAL LOW (ref 0.3–1.2)
Total Protein: 6.2 g/dL — ABNORMAL LOW (ref 6.5–8.1)

## 2020-10-26 LAB — GLUCOSE, CAPILLARY
Glucose-Capillary: 103 mg/dL — ABNORMAL HIGH (ref 70–99)
Glucose-Capillary: 112 mg/dL — ABNORMAL HIGH (ref 70–99)
Glucose-Capillary: 87 mg/dL (ref 70–99)
Glucose-Capillary: 93 mg/dL (ref 70–99)

## 2020-10-26 LAB — PHOSPHORUS: Phosphorus: 3.1 mg/dL (ref 2.5–4.6)

## 2020-10-26 LAB — MAGNESIUM: Magnesium: 1.7 mg/dL (ref 1.7–2.4)

## 2020-10-26 MED ORDER — SODIUM CHLORIDE 0.9 % IV BOLUS
250.0000 mL | Freq: Once | INTRAVENOUS | Status: AC
  Administered 2020-10-26: 250 mL via INTRAVENOUS

## 2020-10-26 MED ORDER — TRAVASOL 10 % IV SOLN
INTRAVENOUS | Status: AC
  Filled 2020-10-26: qty 546

## 2020-10-26 MED ORDER — MAGNESIUM SULFATE IN D5W 1-5 GM/100ML-% IV SOLN
1.0000 g | Freq: Once | INTRAVENOUS | Status: AC
  Administered 2020-10-26: 1 g via INTRAVENOUS
  Filled 2020-10-26: qty 100
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
## 2020-10-27 DIAGNOSIS — K5649 Other impaction of intestine: Secondary | ICD-10-CM | POA: Diagnosis not present

## 2020-10-27 LAB — CBC
HCT: 28.9 % — ABNORMAL LOW (ref 39.0–52.0)
Hemoglobin: 9.7 g/dL — ABNORMAL LOW (ref 13.0–17.0)
MCH: 26.9 pg (ref 26.0–34.0)
MCHC: 33.6 g/dL (ref 30.0–36.0)
MCV: 80.3 fL (ref 80.0–100.0)
Platelets: 329 10*3/uL (ref 150–400)
RBC: 3.6 MIL/uL — ABNORMAL LOW (ref 4.22–5.81)
RDW: 14.6 % (ref 11.5–15.5)
WBC: 6.6 10*3/uL (ref 4.0–10.5)
nRBC: 0 % (ref 0.0–0.2)

## 2020-10-27 LAB — PHOSPHORUS: Phosphorus: 3.4 mg/dL (ref 2.5–4.6)

## 2020-10-27 LAB — GLUCOSE, CAPILLARY
Glucose-Capillary: 105 mg/dL — ABNORMAL HIGH (ref 70–99)
Glucose-Capillary: 90 mg/dL (ref 70–99)
Glucose-Capillary: 92 mg/dL (ref 70–99)
Glucose-Capillary: 95 mg/dL (ref 70–99)

## 2020-10-27 LAB — BASIC METABOLIC PANEL
Anion gap: 8 (ref 5–15)
BUN: 8 mg/dL (ref 6–20)
CO2: 25 mmol/L (ref 22–32)
Calcium: 9.4 mg/dL (ref 8.9–10.3)
Chloride: 105 mmol/L (ref 98–111)
Creatinine, Ser: <0.3 mg/dL — ABNORMAL LOW (ref 0.61–1.24)
Glucose, Bld: 84 mg/dL (ref 70–99)
Potassium: 4.1 mmol/L (ref 3.5–5.1)
Sodium: 138 mmol/L (ref 135–145)

## 2020-10-27 LAB — MAGNESIUM: Magnesium: 2 mg/dL (ref 1.7–2.4)

## 2020-10-27 MED ORDER — ADULT MULTIVITAMIN W/MINERALS CH
1.0000 | ORAL_TABLET | Freq: Every day | ORAL | Status: DC
  Administered 2020-10-28 – 2021-02-06 (×101): 1 via ORAL
  Filled 2020-10-27 (×101): qty 1

## 2020-10-27 MED ORDER — TRAVASOL 10 % IV SOLN
INTRAVENOUS | Status: AC
  Filled 2020-10-27: qty 546

## 2020-10-27 MED ORDER — ENSURE ENLIVE PO LIQD
237.0000 mL | Freq: Three times a day (TID) | ORAL | Status: DC
  Administered 2020-10-27 – 2021-02-06 (×283): 237 mL via ORAL
  Filled 2020-10-27 (×3): qty 237
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
## 2020-10-28 DIAGNOSIS — K5649 Other impaction of intestine: Secondary | ICD-10-CM | POA: Diagnosis not present

## 2020-10-28 LAB — COMPREHENSIVE METABOLIC PANEL
ALT: 63 U/L — ABNORMAL HIGH (ref 0–44)
AST: 52 U/L — ABNORMAL HIGH (ref 15–41)
Albumin: 3.2 g/dL — ABNORMAL LOW (ref 3.5–5.0)
Alkaline Phosphatase: 71 U/L (ref 38–126)
Anion gap: 9 (ref 5–15)
BUN: 11 mg/dL (ref 6–20)
CO2: 26 mmol/L (ref 22–32)
Calcium: 9.1 mg/dL (ref 8.9–10.3)
Chloride: 103 mmol/L (ref 98–111)
Creatinine, Ser: 0.34 mg/dL — ABNORMAL LOW (ref 0.61–1.24)
GFR, Estimated: >60 mL/min (ref >60)
Glucose, Bld: 104 mg/dL — ABNORMAL HIGH (ref 70–99)
Potassium: 4 mmol/L (ref 3.5–5.1)
Sodium: 138 mmol/L (ref 135–145)
Total Bilirubin: 0.4 mg/dL (ref 0.3–1.2)
Total Protein: 5.9 g/dL — ABNORMAL LOW (ref 6.5–8.1)

## 2020-10-28 LAB — RETICULOCYTES
Immature Retic Fract: 10.3 % (ref 2.3–15.9)
RBC.: 3.8 MIL/uL — ABNORMAL LOW (ref 4.22–5.81)
Retic Count, Absolute: 55.5 10*3/uL (ref 19.0–186.0)
Retic Ct Pct: 1.5 % (ref 0.4–3.1)

## 2020-10-28 LAB — CBC
HCT: 30.9 % — ABNORMAL LOW (ref 39.0–52.0)
Hemoglobin: 10.2 g/dL — ABNORMAL LOW (ref 13.0–17.0)
MCH: 27.1 pg (ref 26.0–34.0)
MCHC: 33 g/dL (ref 30.0–36.0)
MCV: 82 fL (ref 80.0–100.0)
Platelets: 354 10*3/uL (ref 150–400)
RBC: 3.77 MIL/uL — ABNORMAL LOW (ref 4.22–5.81)
RDW: 14.8 % (ref 11.5–15.5)
WBC: 5.5 10*3/uL (ref 4.0–10.5)
nRBC: 0 % (ref 0.0–0.2)

## 2020-10-28 LAB — PHOSPHORUS: Phosphorus: 3.6 mg/dL (ref 2.5–4.6)

## 2020-10-28 LAB — GLUCOSE, CAPILLARY
Glucose-Capillary: 101 mg/dL — ABNORMAL HIGH (ref 70–99)
Glucose-Capillary: 105 mg/dL — ABNORMAL HIGH (ref 70–99)
Glucose-Capillary: 105 mg/dL — ABNORMAL HIGH (ref 70–99)

## 2020-10-28 LAB — IRON AND TIBC
Iron: 61 ug/dL (ref 45–182)
Saturation Ratios: 21 % (ref 17.9–39.5)
TIBC: 293 ug/dL (ref 250–450)
UIBC: 232 ug/dL

## 2020-10-28 LAB — FERRITIN: Ferritin: 180 ng/mL (ref 24–336)

## 2020-10-28 LAB — VITAMIN B12: Vitamin B-12: 833 pg/mL (ref 180–914)

## 2020-10-28 LAB — MAGNESIUM: Magnesium: 1.9 mg/dL (ref 1.7–2.4)

## 2020-10-28 LAB — FOLATE: Folate: 9.8 ng/mL (ref >5.9)

## 2020-10-28 MED ORDER — ORAL CARE MOUTH RINSE
15.0000 mL | Freq: Two times a day (BID) | OROMUCOSAL | Status: DC
  Administered 2020-10-28 – 2021-02-06 (×128): 15 mL via OROMUCOSAL

## 2020-10-28 MED ORDER — POLYETHYLENE GLYCOL 3350 17 G PO PACK
17.0000 g | PACK | Freq: Every day | ORAL | Status: DC
  Administered 2020-10-29 – 2021-02-06 (×98): 17 g via ORAL
  Filled 2020-10-28 (×99): qty 1

## 2020-10-28 NOTE — Plan of Care (Signed)
  Problem: Education: Goal: Knowledge of General Education information will improve Description: Including pain rating scale, medication(s)/side effects and non-pharmacologic comfort measures Outcome: Progressing   Problem: Nutrition: Goal: Adequate nutrition will be maintained Outcome: Progressing   
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
## 2020-10-28 NOTE — Progress Notes (Addendum)
Patient is currently Yellow MEWS. He is not in any distress. MD and charge notified. Awaiting orders. Will continue to monitor.   10/28/20 1813  Vitals  Temp 98.4 F (36.9 C)  BP (!) 98/56  MAP (mmHg) 69  BP Location Right Arm  BP Method Automatic  Patient Position (if appropriate) Lying  Pulse Rate (!) 109  Resp 14  MEWS COLOR  MEWS Score Color Yellow  Oxygen Therapy  SpO2 100 %  O2 Device Room Air  Pain Assessment  Pain Scale FLACC  Faces Pain Scale 2  MEWS Score  MEWS Temp 0  MEWS Systolic 1  MEWS Pulse 1  MEWS RR 0  MEWS LOC 0  MEWS Score 2  Provider Notification  Provider Name/Title Lanae Boast, MD  Date Provider Notified 10/28/20  Time Provider Notified [phone]  Notification Type Page  Notification Reason Other (Comment) (Yellow MEws)  Provider response Other (Comment) (awaiting response)    18:35 Per MD HR and BP are at baseline.

## 2020-10-28 NOTE — Progress Notes (Addendum)
TF weaned to 30 m//hr per order.   6:27 Pm TPN stopped at this time per order. Picc flushed with 10cc NS and clamped. IV team consulted.

## 2020-10-29 DIAGNOSIS — K5649 Other impaction of intestine: Secondary | ICD-10-CM | POA: Diagnosis not present

## 2020-10-29 NOTE — TOC Progression Note (Signed)
Transition of Care [facility]) - Progression Note    Patient Details  Name: WLLIAM GROSSO MRN: [id number] Date of Birth: 02-Dec-2000  Transition of Care [facility]) CM/SW Contact  Carley Hammed, [place] Phone Number: 10/29/2020, 12:15 PM  Clinical Narrative:     CSW was asked for clarification by nursing staff about DSS involvement and what level of contact the mother can have at this time. CSW reviewed notes and there is nothing specific charted. It appears DSS is pursuing guardianship, but the mother is technically next of kin. CSW unable to get in contact with pt's social worker on the weekend, but called DSS on call to see if they had anymore information. CSW was advised DSS would have to review and follow up with CSW. CSW advised nursing staff that it was CSW's impression that the barrier at this time was pt returning home with mother, not that mother could not be updated. CSW will wait for further clarification.  12:45 DSS provided clarification that at this time there are no barriers to visitation or providing mother with updates. DSS noted that it is at the hospitals discretion to restrict visitation if it is deemed mother is acting inappropriately. Interim guardianship was filed for on the 9th. TOC will continue to follow.      Expected Discharge Plan and Services                                                 Social Determinants of Health (SDOH) Interventions    Readmission Risk Interventions No flowsheet data found.

## 2020-10-29 NOTE — Plan of Care (Signed)
  Problem: Nutrition: Goal: Adequate nutrition will be maintained Outcome: Progressing   
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
## 2020-10-30 DIAGNOSIS — K5649 Other impaction of intestine: Secondary | ICD-10-CM | POA: Diagnosis not present

## 2020-10-30 LAB — COMPREHENSIVE METABOLIC PANEL
ALT: 99 U/L — ABNORMAL HIGH (ref 0–44)
AST: 58 U/L — ABNORMAL HIGH (ref 15–41)
Albumin: 3.1 g/dL — ABNORMAL LOW (ref 3.5–5.0)
Alkaline Phosphatase: 66 U/L (ref 38–126)
Anion gap: 7 (ref 5–15)
BUN: 15 mg/dL (ref 6–20)
CO2: 27 mmol/L (ref 22–32)
Calcium: 9.1 mg/dL (ref 8.9–10.3)
Chloride: 103 mmol/L (ref 98–111)
Creatinine, Ser: 0.31 mg/dL — ABNORMAL LOW (ref 0.61–1.24)
GFR, Estimated: >60 mL/min (ref >60)
Glucose, Bld: 87 mg/dL (ref 70–99)
Potassium: 4.1 mmol/L (ref 3.5–5.1)
Sodium: 137 mmol/L (ref 135–145)
Total Bilirubin: 0.5 mg/dL (ref 0.3–1.2)
Total Protein: 5.7 g/dL — ABNORMAL LOW (ref 6.5–8.1)

## 2020-10-30 LAB — CBC
HCT: 26.9 % — ABNORMAL LOW (ref 39.0–52.0)
Hemoglobin: 9.2 g/dL — ABNORMAL LOW (ref 13.0–17.0)
MCH: 27.4 pg (ref 26.0–34.0)
MCHC: 34.2 g/dL (ref 30.0–36.0)
MCV: 80.1 fL (ref 80.0–100.0)
Platelets: 315 10*3/uL (ref 150–400)
RBC: 3.36 MIL/uL — ABNORMAL LOW (ref 4.22–5.81)
RDW: 14.8 % (ref 11.5–15.5)
WBC: 6.9 10*3/uL (ref 4.0–10.5)
nRBC: 0 % (ref 0.0–0.2)

## 2020-10-30 LAB — MAGNESIUM: Magnesium: 1.8 mg/dL (ref 1.7–2.4)

## 2020-10-30 LAB — PHOSPHORUS: Phosphorus: 5 mg/dL — ABNORMAL HIGH (ref 2.5–4.6)

## 2020-10-30 NOTE — Progress Notes (Signed)
   10/30/20 1803  Assess: MEWS Score  Temp 100.3 F (37.9 C)  BP 109/77  Pulse Rate (!) 117  Resp 18  SpO2 98 %  O2 Device Room Air  Assess: MEWS Score  MEWS Temp 0  MEWS Systolic 0  MEWS Pulse 2  MEWS RR 0  MEWS LOC 0  MEWS Score 2  MEWS Score Color Yellow  Assess: if the MEWS score is Yellow or Red  Were vital signs taken at a resting state? Yes  Focused Assessment No change from prior assessment  Early Detection of Sepsis Score *See Row Information* Low  Notify: Charge Nurse/RN  Name of Charge Nurse/RN Notified Henchy Mccauley A.,  Date Charge Nurse/RN Notified 10/30/20  Time Charge Nurse/RN Notified 1806  Document  Patient Outcome Other (Comment) (stable)
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
## 2020-10-31 DIAGNOSIS — K5649 Other impaction of intestine: Secondary | ICD-10-CM | POA: Diagnosis not present

## 2020-10-31 MED ORDER — SENNA 8.6 MG PO TABS
1.0000 | ORAL_TABLET | Freq: Every day | ORAL | Status: DC
  Administered 2020-10-31 – 2020-11-07 (×8): 8.6 mg via ORAL
  Filled 2020-10-31 (×8): qty 1

## 2020-10-31 MED ORDER — BISACODYL 10 MG RE SUPP
10.0000 mg | Freq: Every day | RECTAL | Status: DC | PRN
  Administered 2020-10-31 – 2021-02-03 (×3): 10 mg via RECTAL
  Filled 2020-10-31 (×4): qty 1
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
## 2020-10-31 NOTE — TOC Progression Note (Signed)
Transition of Care [facility]) - Progression Note    Patient Details  Name: ELADIO DENTREMONT MRN: [id number] Date of Birth: [birth year]  [facility] CM/SW Contact  Okey Dupre Lazaro Arms, LCSW Phone Number: 10/31/2020, 2:21 PM  Clinical Narrative:  Call made to Adult Protective Social Worker, Ignacia Bayley [phone]) and message left asking her to call CSW regarding patient. CSW will continue to follow and provide SW intervention services as needed through discharge.          Expected Discharge Plan and Services                                                 Social Determinants of Health (SDOH) Interventions  Adult Protective Services involved.   Readmission Risk Interventions No flowsheet data found.

## 2020-10-31 NOTE — Progress Notes (Signed)
   10/31/20 1746  Assess: MEWS Score  Temp 98.1 F (36.7 C)  BP 93/72  Pulse Rate (!) 109  Resp 16  SpO2 100 %  O2 Device Room Air  Assess: MEWS Score  MEWS Temp 0  MEWS Systolic 1  MEWS Pulse 1  MEWS RR 0  MEWS LOC 0  MEWS Score 2  MEWS Score Color Yellow  Assess: if the MEWS score is Yellow or Red  Were vital signs taken at a resting state? Yes  Focused Assessment No change from prior assessment  Early Detection of Sepsis Score *See Row Information* Low  MEWS guidelines implemented *See Row Information* No, previously yellow, continue vital signs every 4 hours  Document  Patient Outcome  (Patient is stable at this time. will continue to monitor.)
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
## 2020-11-01 DIAGNOSIS — G809 Cerebral palsy, unspecified: Secondary | ICD-10-CM | POA: Diagnosis not present

## 2020-11-01 DIAGNOSIS — E43 Unspecified severe protein-calorie malnutrition: Secondary | ICD-10-CM | POA: Diagnosis not present

## 2020-11-01 DIAGNOSIS — R64 Cachexia: Secondary | ICD-10-CM | POA: Diagnosis not present

## 2020-11-01 DIAGNOSIS — Z515 Encounter for palliative care: Secondary | ICD-10-CM | POA: Diagnosis not present

## 2020-11-01 DIAGNOSIS — K5649 Other impaction of intestine: Secondary | ICD-10-CM | POA: Diagnosis not present
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
## 2020-11-02 DIAGNOSIS — K5649 Other impaction of intestine: Secondary | ICD-10-CM | POA: Diagnosis not present

## 2020-11-02 LAB — COMPREHENSIVE METABOLIC PANEL
ALT: 50 U/L — ABNORMAL HIGH (ref 0–44)
AST: 23 U/L (ref 15–41)
Albumin: 3.3 g/dL — ABNORMAL LOW (ref 3.5–5.0)
Alkaline Phosphatase: 54 U/L (ref 38–126)
Anion gap: 7 (ref 5–15)
BUN: 15 mg/dL (ref 6–20)
CO2: 28 mmol/L (ref 22–32)
Calcium: 9.2 mg/dL (ref 8.9–10.3)
Chloride: 106 mmol/L (ref 98–111)
Creatinine, Ser: 0.45 mg/dL — ABNORMAL LOW (ref 0.61–1.24)
GFR, Estimated: >60 mL/min (ref >60)
Glucose, Bld: 97 mg/dL (ref 70–99)
Potassium: 4.2 mmol/L (ref 3.5–5.1)
Sodium: 141 mmol/L (ref 135–145)
Total Bilirubin: 0.3 mg/dL (ref 0.3–1.2)
Total Protein: 5.7 g/dL — ABNORMAL LOW (ref 6.5–8.1)

## 2020-11-02 LAB — CBC
HCT: 27.2 % — ABNORMAL LOW (ref 39.0–52.0)
Hemoglobin: 9.1 g/dL — ABNORMAL LOW (ref 13.0–17.0)
MCH: 27.5 pg (ref 26.0–34.0)
MCHC: 33.5 g/dL (ref 30.0–36.0)
MCV: 82.2 fL (ref 80.0–100.0)
Platelets: 231 10*3/uL (ref 150–400)
RBC: 3.31 MIL/uL — ABNORMAL LOW (ref 4.22–5.81)
RDW: 15.7 % — ABNORMAL HIGH (ref 11.5–15.5)
WBC: 9.6 10*3/uL (ref 4.0–10.5)
nRBC: 0 % (ref 0.0–0.2)

## 2020-11-02 LAB — MAGNESIUM: Magnesium: 1.9 mg/dL (ref 1.7–2.4)

## 2020-11-02 LAB — PHOSPHORUS: Phosphorus: 3.9 mg/dL (ref 2.5–4.6)
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
## 2020-11-02 NOTE — Plan of Care (Signed)
  Problem: Clinical Measurements: Goal: Will remain free from infection Outcome: Completed/Met Goal: Diagnostic test results will improve Outcome: Completed/Met
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
## 2020-11-02 NOTE — Plan of Care (Signed)
  Problem: Safety: Goal: Ability to remain free from injury will improve Outcome: Progressing   

## 2020-11-02 NOTE — Plan of Care (Signed)
  Problem: Education: Goal: Knowledge of General Education information will improve Description: Including pain rating scale, medication(s)/side effects and non-pharmacologic comfort measures Outcome: Completed/Met   Problem: Clinical Measurements: Goal: Respiratory complications will improve Outcome: Completed/Met Goal: Cardiovascular complication will be avoided Outcome: Completed/Met   

## 2020-11-03 DIAGNOSIS — K5649 Other impaction of intestine: Secondary | ICD-10-CM | POA: Diagnosis not present
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
## 2020-11-04 DIAGNOSIS — K5649 Other impaction of intestine: Secondary | ICD-10-CM | POA: Diagnosis not present

## 2020-11-04 MED ORDER — SODIUM CHLORIDE 0.9 % IV BOLUS: 1000.0000 mL | Freq: Once | INTRAVENOUS | Status: DC

## 2020-11-04 MED ORDER — SODIUM CHLORIDE 0.9 % IV SOLN: INTRAVENOUS | Status: DC

## 2020-11-04 MED ORDER — SODIUM CHLORIDE 0.9 % IV BOLUS
500.0000 mL | Freq: Once | INTRAVENOUS | Status: AC
  Administered 2020-11-04: 500 mL via INTRAVENOUS

## 2020-11-04 MED ORDER — SODIUM CHLORIDE 0.9 % NICU IV INFUSION SIMPLE
500.0000 mL | INJECTION | Freq: Once | INTRAVENOUS | Status: AC
  Administered 2020-11-04: 500 mL via INTRAVENOUS
  Filled 2020-11-04: qty 500

## 2020-11-04 NOTE — Progress Notes (Signed)
Patient's BP after 500 cc NS bolus 84/57 MAP 66 HR 106 T 97.8. Patient asleep at this time. Crosley,MD notified via secure chat. New order received. Will continue to monitor.
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
## 2020-11-04 NOTE — Progress Notes (Signed)
NOTIFIED ON CALL MD THAT PATIENT BP 84/48.PT SLEEPING AND NO DISTRESS.

## 2020-11-04 NOTE — Progress Notes (Signed)
Patient had bowel movement last night,brown,pasty, small amount. Patient was cleaned and made comfortable.

## 2020-11-05 DIAGNOSIS — K5649 Other impaction of intestine: Secondary | ICD-10-CM | POA: Diagnosis not present

## 2020-11-05 MED ORDER — POLYETHYLENE GLYCOL 3350 17 G PO PACK: 17.0000 g | PACK | Freq: Every day | ORAL | Status: DC

## 2020-11-05 MED ORDER — ENOXAPARIN SODIUM 40 MG/0.4ML IJ SOSY: 40.0000 mg | PREFILLED_SYRINGE | INTRAMUSCULAR | Status: DC
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
## 2020-11-05 NOTE — Plan of Care (Signed)
°  Problem: Clinical Measurements: °Goal: Ability to maintain clinical measurements within normal limits will improve °Outcome: Progressing °  °Problem: Activity: °Goal: Risk for activity intolerance will decrease °Outcome: Progressing °  °Problem: Nutrition: °Goal: Adequate nutrition will be maintained °Outcome: Progressing °  °

## 2020-11-06 DIAGNOSIS — K5649 Other impaction of intestine: Secondary | ICD-10-CM | POA: Diagnosis not present

## 2020-11-06 NOTE — Plan of Care (Signed)
  Problem: Activity: Goal: Risk for activity intolerance will decrease Outcome: Progressing   Problem: Nutrition: Goal: Adequate nutrition will be maintained Outcome: Progressing   
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
## 2020-11-07 DIAGNOSIS — K5649 Other impaction of intestine: Secondary | ICD-10-CM | POA: Diagnosis not present
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
## 2020-11-07 NOTE — TOC Progression Note (Signed)
Transition of Care [facility]) - Progression Note    Patient Details  Name: Gary Warren MRN: [id number] Date of Birth: 2000-08-20  [facility]) CM/SW Contact  Okey Dupre Lazaro Arms, LCSW Phone Number: 11/07/2020, 2:50 PM  Clinical Narrative:  Talked with Adult Protective Services (APS) Ignacia Bayley [phone]) regarding status of guardianship petition and if the patient needs to remain in the hospital through disposition. Hansel Starling went to her supervisor's office while talking to this CSW and supervisor in agreement with her that [name] needs to remain in the hospital as he does not have a safe discharge plan due to their concerns with his mother providing inconsistent care of her son. CSW informed that the guardianship petition has been sent to the county attorney for review and once approved, they will be filing for guardianship of [name]. Ms. Kyla Balzarine was informed that patient has gained weight and she was informed of his current weight.    CSW will continue to follow and provide SW intervention services as needed or requested.           Expected Discharge Plan and Services                                                 Social Determinants of Health (SDOH) Interventions    Readmission Risk Interventions No flowsheet data found.

## 2020-11-08 DIAGNOSIS — K5649 Other impaction of intestine: Secondary | ICD-10-CM | POA: Diagnosis not present
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
## 2020-11-08 NOTE — Progress Notes (Signed)
   11/08/20 0828  Assess: MEWS Score  Temp 98.2 F (36.8 C)  BP (!) 90/48  Pulse Rate (!) 109  Resp 18  SpO2 99 %  O2 Device Room Air  Assess: MEWS Score  MEWS Temp 0  MEWS Systolic 1  MEWS Pulse 1  MEWS RR 0  MEWS LOC 0  MEWS Score 2  MEWS Score Color Yellow  Assess: if the MEWS score is Yellow or Red  Were vital signs taken at a resting state? Yes  Focused Assessment No change from prior assessment  Early Detection of Sepsis Score *See Row Information* Low  MEWS guidelines implemented *See Row Information* Yes  Take Vital Signs  Increase Vital Sign Frequency  Yellow: Q 2hr X 2 then Q 4hr X 2, if remains yellow, continue Q 4hrs  Escalate  MEWS: Escalate Yellow: discuss with charge nurse/RN and consider discussing with provider and RRT  Notify: Charge Nurse/RN  Name of Charge Nurse/RN Notified Taveon Enyeart A.,RN  Date Charge Nurse/RN Notified 11/08/20  Time Charge Nurse/RN Notified 0831  Document  Patient Outcome Other (Comment) (no change in status pt stable continue to monitor)
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
## 2020-11-09 DIAGNOSIS — K5649 Other impaction of intestine: Secondary | ICD-10-CM | POA: Diagnosis not present
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
## 2020-11-09 NOTE — TOC Progression Note (Addendum)
Transition of Care (TOC) - Progression Note  *Please see information below from Adult Protective Services SW in bold.   Patient Details  Name: Gary Warren MRN: [id number] Date of Birth: Oct 07, 2000  Transition of Care [facility]) CM/SW Contact  Okey Dupre Lazaro Arms, LCSW Phone Number: 11/09/2020, 5:41 PM  Clinical Narrative:  CSW received a call from Ignacia Bayley, Adult [employer] social worker with [facility]. Of Social Services. CSW advised that they are working on Guardianship and the county attorney is involved. CSW advised that the court date is tomorrow and an FL-2 may needed at some point.   **Ms. Kyla Balzarine requested that if patient's mother asks about her son's discharge disposition that NOTHING is disclosed to her, other than her son is being medically evaluated.  If she asks how her son is doing, a brief response can be given that he is still being medically evaluated, but no details should be provided. **This information was provided to the charge nurse (approx. 5:54 pm) as patient's nurse was in a patient's room.       Expected Discharge Plan and Services  To be determined                                               Social Determinants of Health (SDOH) Interventions  Patient's d/c disposition is still pending  Readmission Risk Interventions No flowsheet data found.
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
## 2020-11-10 DIAGNOSIS — K5649 Other impaction of intestine: Secondary | ICD-10-CM | POA: Diagnosis not present
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
## 2020-11-11 DIAGNOSIS — K5649 Other impaction of intestine: Secondary | ICD-10-CM | POA: Diagnosis not present
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
## 2020-11-12 DIAGNOSIS — K5649 Other impaction of intestine: Secondary | ICD-10-CM | POA: Diagnosis not present
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
## 2020-11-13 DIAGNOSIS — K5649 Other impaction of intestine: Secondary | ICD-10-CM | POA: Diagnosis not present

## 2020-11-13 NOTE — Plan of Care (Signed)
  Problem: Health Behavior/Discharge Planning: Goal: Ability to manage health-related needs will improve Outcome: Progressing   Problem: Clinical Measurements: Goal: Ability to maintain clinical measurements within normal limits will improve Outcome: Progressing   
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
## 2020-11-13 NOTE — Plan of Care (Signed)
  Problem: Coping: Goal: Level of anxiety will decrease Outcome: Progressing   Problem: Elimination: Goal: Will not experience complications related to bowel motility Outcome: Progressing Goal: Will not experience complications related to urinary retention Outcome: Progressing   Problem: Skin Integrity: Goal: Risk for impaired skin integrity will decrease Outcome: Progressing   

## 2020-11-14 DIAGNOSIS — K5649 Other impaction of intestine: Secondary | ICD-10-CM | POA: Diagnosis not present
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
## 2020-11-14 NOTE — Plan of Care (Signed)
  Problem: Health Behavior/Discharge Planning: Goal: Ability to manage health-related needs will improve Outcome: Progressing   
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
## 2020-11-14 NOTE — Plan of Care (Signed)
  Problem: Activity: Goal: Risk for activity intolerance will decrease Outcome: Progressing   

## 2020-11-15 DIAGNOSIS — G809 Cerebral palsy, unspecified: Secondary | ICD-10-CM | POA: Diagnosis not present

## 2020-11-15 DIAGNOSIS — K5649 Other impaction of intestine: Secondary | ICD-10-CM | POA: Diagnosis not present

## 2020-11-15 DIAGNOSIS — E43 Unspecified severe protein-calorie malnutrition: Secondary | ICD-10-CM | POA: Diagnosis not present

## 2020-11-15 DIAGNOSIS — R64 Cachexia: Secondary | ICD-10-CM | POA: Diagnosis not present
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
## 2020-11-15 NOTE — Plan of Care (Signed)
  Problem: Activity: Goal: Risk for activity intolerance will decrease Outcome: Progressing   Problem: Skin Integrity: Goal: Risk for impaired skin integrity will decrease Outcome: Progressing   

## 2020-11-16 DIAGNOSIS — K5649 Other impaction of intestine: Secondary | ICD-10-CM | POA: Diagnosis not present

## 2020-11-16 LAB — CBC
HCT: 31.6 % — ABNORMAL LOW (ref 39.0–52.0)
Hemoglobin: 10.6 g/dL — ABNORMAL LOW (ref 13.0–17.0)
MCH: 27.9 pg (ref 26.0–34.0)
MCHC: 33.5 g/dL (ref 30.0–36.0)
MCV: 83.2 fL (ref 80.0–100.0)
Platelets: 518 10*3/uL — ABNORMAL HIGH (ref 150–400)
RBC: 3.8 MIL/uL — ABNORMAL LOW (ref 4.22–5.81)
RDW: 16.6 % — ABNORMAL HIGH (ref 11.5–15.5)
WBC: 7.5 10*3/uL (ref 4.0–10.5)
nRBC: 0.4 % — ABNORMAL HIGH (ref 0.0–0.2)

## 2020-11-16 LAB — COMPREHENSIVE METABOLIC PANEL
ALT: 44 U/L (ref 0–44)
AST: 24 U/L (ref 15–41)
Albumin: 3.4 g/dL — ABNORMAL LOW (ref 3.5–5.0)
Alkaline Phosphatase: 93 U/L (ref 38–126)
Anion gap: 7 (ref 5–15)
BUN: 17 mg/dL (ref 6–20)
CO2: 26 mmol/L (ref 22–32)
Calcium: 9.7 mg/dL (ref 8.9–10.3)
Chloride: 105 mmol/L (ref 98–111)
Creatinine, Ser: 0.49 mg/dL — ABNORMAL LOW (ref 0.61–1.24)
GFR, Estimated: >60 mL/min (ref >60)
Glucose, Bld: 103 mg/dL — ABNORMAL HIGH (ref 70–99)
Potassium: 3.8 mmol/L (ref 3.5–5.1)
Sodium: 138 mmol/L (ref 135–145)
Total Bilirubin: 0.3 mg/dL (ref 0.3–1.2)
Total Protein: 6.4 g/dL — ABNORMAL LOW (ref 6.5–8.1)

## 2020-11-16 LAB — MAGNESIUM: Magnesium: 2.2 mg/dL (ref 1.7–2.4)

## 2020-11-16 NOTE — Plan of Care (Signed)
°  Problem: Health Behavior/Discharge Planning: °Goal: Ability to manage health-related needs will improve °Outcome: Progressing °  °Problem: Skin Integrity: °Goal: Risk for impaired skin integrity will decrease °Outcome: Progressing °  °
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
## 2020-11-17 DIAGNOSIS — K5649 Other impaction of intestine: Secondary | ICD-10-CM | POA: Diagnosis not present
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
## 2020-11-18 DIAGNOSIS — K5649 Other impaction of intestine: Secondary | ICD-10-CM | POA: Diagnosis not present
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
## 2020-11-19 DIAGNOSIS — K5649 Other impaction of intestine: Secondary | ICD-10-CM | POA: Diagnosis not present

## 2020-11-19 NOTE — Plan of Care (Signed)
  Problem: Health Behavior/Discharge Planning: Goal: Ability to manage health-related needs will improve Outcome: Progressing   Problem: Activity: Goal: Risk for activity intolerance will decrease Outcome: Progressing   Problem: Elimination: Goal: Will not experience complications related to bowel motility Outcome: Progressing   Problem: Skin Integrity: Goal: Risk for impaired skin integrity will decrease Outcome: Progressing
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
## 2020-11-20 DIAGNOSIS — K5649 Other impaction of intestine: Secondary | ICD-10-CM | POA: Diagnosis not present

## 2020-11-20 LAB — CBC
HCT: 34.7 % — ABNORMAL LOW (ref 39.0–52.0)
Hemoglobin: 11.3 g/dL — ABNORMAL LOW (ref 13.0–17.0)
MCH: 27.4 pg (ref 26.0–34.0)
MCHC: 32.6 g/dL (ref 30.0–36.0)
MCV: 84 fL (ref 80.0–100.0)
Platelets: 481 10*3/uL — ABNORMAL HIGH (ref 150–400)
RBC: 4.13 MIL/uL — ABNORMAL LOW (ref 4.22–5.81)
RDW: 16.7 % — ABNORMAL HIGH (ref 11.5–15.5)
WBC: 8.2 10*3/uL (ref 4.0–10.5)
nRBC: 0.5 % — ABNORMAL HIGH (ref 0.0–0.2)

## 2020-11-20 LAB — BASIC METABOLIC PANEL
Anion gap: 8 (ref 5–15)
BUN: 18 mg/dL (ref 6–20)
CO2: 28 mmol/L (ref 22–32)
Calcium: 9.9 mg/dL (ref 8.9–10.3)
Chloride: 105 mmol/L (ref 98–111)
Creatinine, Ser: 0.46 mg/dL — ABNORMAL LOW (ref 0.61–1.24)
GFR, Estimated: >60 mL/min (ref >60)
Glucose, Bld: 101 mg/dL — ABNORMAL HIGH (ref 70–99)
Potassium: 3.9 mmol/L (ref 3.5–5.1)
Sodium: 141 mmol/L (ref 135–145)
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
## 2020-11-21 DIAGNOSIS — K5649 Other impaction of intestine: Secondary | ICD-10-CM | POA: Diagnosis not present

## 2020-11-21 NOTE — [place] FL2 (Addendum)
[place] MEDICAID FL2 LEVEL OF CARE SCREENING TOOL     IDENTIFICATION  Patient Name: Gary Warren Birthdate: 2000-07-26 Sex: male Admission Date (Current Location): 10/18/2020  [place] and [place] Number:  Haynes Bast [id number] P Facility and Address:  [facility], [address] [place], [place] [address]      Provider Number: [id number]  Attending Physician Name and Address:  Lanae Boast, MD  Relative Name and Phone Number:       Current Level of Care: Hospital Recommended Level of Care: Skilled Nursing Facility Prior Approval Number:    Date Approved/Denied:   PASRR Number:  [id number]  Discharge Plan: SNF    Current Diagnoses: Patient Active Problem List   Diagnosis Date Noted   Protein-calorie malnutrition, severe 10/21/2020   Pressure injury of skin 10/19/2020   Emaciation (HCC) 10/18/2020   Impaction of colon (HCC) 10/18/2020   Cerebral palsy (HCC) 12/13/2019   Hx of failure to thrive syndrome 12/13/2019   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   Constipation 08/30/2015    Orientation RESPIRATION BLADDER Height & Weight      (Unable to assess - nonverbal)  Normal Incontinent Weight: 65 lb 8 oz (29.7 kg) Height:  4\' 6"  (137.2 cm)  BEHAVIORAL SYMPTOMS/MOOD NEUROLOGICAL BOWEL NUTRITION STATUS      Incontinent Diet (See discharge summary)  AMBULATORY STATUS COMMUNICATION OF NEEDS Skin   Total Care Non-Verbally PU Stage and Appropriate Care   PU Stage 2 Dressing:  (See discharge summary for dressing changes)                   Personal Care Assistance Level of Assistance  Bathing, Dressing, Total care, Feeding Bathing Assistance: Maximum assistance Feeding assistance: Maximum assistance Dressing Assistance: Maximum assistance Total Care Assistance: Maximum assistance   Functional Limitations Info  Sight, Hearing, Speech Sight Info: Adequate Hearing Info: Adequate Speech Info: Impaired    SPECIAL CARE FACTORS FREQUENCY  PT  (By licensed PT), OT (By licensed OT)     PT Frequency: 5x weekly OT Frequency: 5x weekly            Contractures Contractures Info: Not present    Additional Factors Info  Allergies, Code Status Code Status Info: Full Code Allergies Info: No known allergies           Current Medications (11/21/2020):  This is the current hospital active medication list Current Facility-Administered Medications  Medication Dose Route Frequency Provider Last Rate Last Admin   acetaminophen (TYLENOL) tablet 325 mg  325 mg Oral Q6H PRN 01/21/2021, MD       barrier cream (non-specified) 1 application  1 application Topical BID PRN Charlsie Quest, MD       bisacodyl (DULCOLAX) suppository 10 mg  10 mg Rectal Daily PRN Kc, Charlsie Quest, MD   10 mg at 10/31/20 1033   feeding supplement (ENSURE ENLIVE / ENSURE PLUS) liquid 237 mL  237 mL Oral TID BM Kc, Ramesh, MD   237 mL at 11/21/20 0949   MEDLINE mouth rinse  15 mL Mouth Rinse BID Kc, Ramesh, MD   15 mL at 11/21/20 0949   multivitamin with minerals tablet 1 tablet  1 tablet Oral Daily Kc, Ramesh, MD   1 tablet at 11/21/20 0949   ondansetron (ZOFRAN) tablet 4 mg  4 mg Oral Q6H PRN 01/21/21, MD       Or   ondansetron (ZOFRAN) injection 4 mg  4 mg Intravenous Q6H PRN Charlsie Quest  R, MD       polyethylene glycol (MIRALAX / GLYCOLAX) packet 17 g  17 g Oral Daily Kc, Ramesh, MD   17 g at 11/21/20 2947     Discharge Medications: Please see discharge summary for a list of discharge medications.  Relevant Imaging Results:  Relevant Lab Results:   Additional Information SSN: [id number]  Inis Sizer, LCSW
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
## 2020-11-22 DIAGNOSIS — K5649 Other impaction of intestine: Secondary | ICD-10-CM | POA: Diagnosis not present

## 2020-11-22 NOTE — Plan of Care (Signed)
  Problem: Skin Integrity: Goal: Risk for impaired skin integrity will decrease Outcome: Adequate for Discharge   
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
## 2020-11-23 DIAGNOSIS — K5649 Other impaction of intestine: Secondary | ICD-10-CM | POA: Diagnosis not present

## 2020-11-23 NOTE — Progress Notes (Signed)
CSW spoke with staff member at [facility] to complete referral for a care coordinator.  CSW sent patient's clinicals to [place] [facility] [place] via fax for review.  Edwin Dada, MSW, LCSW Transitions of Care  Clinical Social Worker II [phone]
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
## 2020-11-24 DIAGNOSIS — K5649 Other impaction of intestine: Secondary | ICD-10-CM | POA: Diagnosis not present
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
## 2020-11-25 DIAGNOSIS — K5649 Other impaction of intestine: Secondary | ICD-10-CM | POA: Diagnosis not present

## 2020-11-25 LAB — CBC WITH DIFFERENTIAL/PLATELET
Abs Immature Granulocytes: 0.09 10*3/uL — ABNORMAL HIGH (ref 0.00–0.07)
Basophils Absolute: 0.1 10*3/uL (ref 0.0–0.1)
Basophils Relative: 1 %
Eosinophils Absolute: 0.1 10*3/uL (ref 0.0–0.5)
Eosinophils Relative: 1 %
HCT: 34.3 % — ABNORMAL LOW (ref 39.0–52.0)
Hemoglobin: 11.1 g/dL — ABNORMAL LOW (ref 13.0–17.0)
Immature Granulocytes: 1 %
Lymphocytes Relative: 30 %
Lymphs Abs: 2.5 10*3/uL (ref 0.7–4.0)
MCH: 27.4 pg (ref 26.0–34.0)
MCHC: 32.4 g/dL (ref 30.0–36.0)
MCV: 84.7 fL (ref 80.0–100.0)
Monocytes Absolute: 0.9 10*3/uL (ref 0.1–1.0)
Monocytes Relative: 10 %
Neutro Abs: 4.9 10*3/uL (ref 1.7–7.7)
Neutrophils Relative %: 57 %
Platelets: 380 10*3/uL (ref 150–400)
RBC: 4.05 MIL/uL — ABNORMAL LOW (ref 4.22–5.81)
RDW: 16.3 % — ABNORMAL HIGH (ref 11.5–15.5)
WBC: 8.5 10*3/uL (ref 4.0–10.5)
nRBC: 0 % (ref 0.0–0.2)

## 2020-11-25 LAB — COMPREHENSIVE METABOLIC PANEL
ALT: 54 U/L — ABNORMAL HIGH (ref 0–44)
AST: 35 U/L (ref 15–41)
Albumin: 3.9 g/dL (ref 3.5–5.0)
Alkaline Phosphatase: 143 U/L — ABNORMAL HIGH (ref 38–126)
Anion gap: 9 (ref 5–15)
BUN: 17 mg/dL (ref 6–20)
CO2: 27 mmol/L (ref 22–32)
Calcium: 9.9 mg/dL (ref 8.9–10.3)
Chloride: 106 mmol/L (ref 98–111)
Creatinine, Ser: 0.45 mg/dL — ABNORMAL LOW (ref 0.61–1.24)
GFR, Estimated: >60 mL/min (ref >60)
Glucose, Bld: 107 mg/dL — ABNORMAL HIGH (ref 70–99)
Potassium: 3.9 mmol/L (ref 3.5–5.1)
Sodium: 142 mmol/L (ref 135–145)
Total Bilirubin: 0.5 mg/dL (ref 0.3–1.2)
Total Protein: 6.9 g/dL (ref 6.5–8.1)

## 2020-11-25 LAB — PHOSPHORUS: Phosphorus: 5.4 mg/dL — ABNORMAL HIGH (ref 2.5–4.6)

## 2020-11-25 LAB — MAGNESIUM: Magnesium: 2.1 mg/dL (ref 1.7–2.4)
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
## 2020-11-26 DIAGNOSIS — K5649 Other impaction of intestine: Secondary | ICD-10-CM | POA: Diagnosis not present
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
## 2020-11-27 DIAGNOSIS — K5649 Other impaction of intestine: Secondary | ICD-10-CM | POA: Diagnosis not present
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
## 2020-11-27 NOTE — Plan of Care (Signed)
  Problem: Elimination: Goal: Will not experience complications related to bowel motility Outcome: Completed/Met

## 2020-11-28 DIAGNOSIS — K5649 Other impaction of intestine: Secondary | ICD-10-CM | POA: Diagnosis not present

## 2020-11-28 NOTE — Progress Notes (Addendum)
11:35am: CSW spoke with [name] from [place] who states she will attempt to locate an intermediate care facility (ICF) for the patient. Patient cannot be placed in [facility] due to him not having an Risk analyst in place. CSW provided [place] with contact information for Adrienne at [facility] contact information.    10am: CSW attempted to reach Melina Schools at [place] to discuss patient - no answer, a voicemail was left requesting a return call.  Edwin Dada, MSW, LCSW Transitions of Care  Clinical Social Worker II [phone]
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
## 2020-11-29 DIAGNOSIS — K5649 Other impaction of intestine: Secondary | ICD-10-CM | POA: Diagnosis not present

## 2020-11-29 LAB — COMPREHENSIVE METABOLIC PANEL
ALT: 32 U/L (ref 0–44)
AST: 27 U/L (ref 15–41)
Albumin: 3.6 g/dL (ref 3.5–5.0)
Alkaline Phosphatase: 168 U/L — ABNORMAL HIGH (ref 38–126)
Anion gap: 9 (ref 5–15)
BUN: 16 mg/dL (ref 6–20)
CO2: 25 mmol/L (ref 22–32)
Calcium: 9.4 mg/dL (ref 8.9–10.3)
Chloride: 106 mmol/L (ref 98–111)
Creatinine, Ser: 0.43 mg/dL — ABNORMAL LOW (ref 0.61–1.24)
GFR, Estimated: >60 mL/min (ref >60)
Glucose, Bld: 104 mg/dL — ABNORMAL HIGH (ref 70–99)
Potassium: 4 mmol/L (ref 3.5–5.1)
Sodium: 140 mmol/L (ref 135–145)
Total Bilirubin: 0.5 mg/dL (ref 0.3–1.2)
Total Protein: 6.6 g/dL (ref 6.5–8.1)

## 2020-11-29 MED ORDER — LACTATED RINGERS IV BOLUS (SEPSIS)
1000.0000 mL | Freq: Once | INTRAVENOUS | Status: AC
  Administered 2020-11-30: 1000 mL via INTRAVENOUS

## 2020-11-29 MED ORDER — SODIUM CHLORIDE 0.9 % IV SOLN
2.0000 g | Freq: Once | INTRAVENOUS | Status: AC
  Administered 2020-11-30: 2 g via INTRAVENOUS
  Filled 2020-11-29: qty 2

## 2020-11-29 MED ORDER — LACTATED RINGERS IV BOLUS (SEPSIS)
250.0000 mL | Freq: Once | INTRAVENOUS | Status: AC
  Administered 2020-11-30: 250 mL via INTRAVENOUS

## 2020-11-29 MED ORDER — METRONIDAZOLE 500 MG/100ML IV SOLN
500.0000 mg | Freq: Two times a day (BID) | INTRAVENOUS | Status: DC
  Administered 2020-11-30 – 2020-12-05 (×10): 500 mg via INTRAVENOUS
  Filled 2020-11-29 (×11): qty 100

## 2020-11-29 MED ORDER — VANCOMYCIN HCL IN DEXTROSE 1-5 GM/200ML-% IV SOLN
1000.0000 mg | Freq: Once | INTRAVENOUS | Status: DC
  Administered 2020-11-30: 1000 mg via INTRAVENOUS
  Filled 2020-11-29: qty 200

## 2020-11-29 NOTE — Plan of Care (Signed)
  Problem: Clinical Measurements: Goal: Ability to maintain clinical measurements within normal limits will improve Outcome: Completed/Met   Problem: Activity: Goal: Risk for activity intolerance will decrease Outcome: Completed/Met   Problem: Elimination: Goal: Will not experience complications related to urinary retention Outcome: Completed/Met   Problem: Skin Integrity: Goal: Risk for impaired skin integrity will decrease Outcome: Completed/Met
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
## 2020-11-29 NOTE — Progress Notes (Signed)
Overnight event Patient febrile (temperature 100.6 F), tachycardic (heart rate 112), and hypotensive (blood pressure 85/45).  Code sepsis initiated. -Stat labs ordered: CBC, CMP, lactate x2, blood culture x2, UA, PT/INR, APTT, procalcitonin -Stat chest x-ray -Does have pressure ulcers of buttock and heel,?possible source.  However, other sources of infection need to be ruled out. Broad-spectrum antibiotics including vancomycin, cefepime, and metronidazole at this time.   -30 cc/Kg fluid boluses per sepsis protocol -Tylenol as needed for fevers
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
## 2020-11-30 ENCOUNTER — Inpatient Hospital Stay (HOSPITAL_COMMUNITY): Payer: Medicaid Other

## 2020-11-30 DIAGNOSIS — K5649 Other impaction of intestine: Secondary | ICD-10-CM | POA: Diagnosis not present

## 2020-11-30 LAB — COMPREHENSIVE METABOLIC PANEL
ALT: 133 U/L — ABNORMAL HIGH (ref 0–44)
AST: 122 U/L — ABNORMAL HIGH (ref 15–41)
Albumin: 3.5 g/dL (ref 3.5–5.0)
Alkaline Phosphatase: 164 U/L — ABNORMAL HIGH (ref 38–126)
Anion gap: 8 (ref 5–15)
BUN: 14 mg/dL (ref 6–20)
CO2: 24 mmol/L (ref 22–32)
Calcium: 9 mg/dL (ref 8.9–10.3)
Chloride: 103 mmol/L (ref 98–111)
Creatinine, Ser: 0.55 mg/dL — ABNORMAL LOW (ref 0.61–1.24)
GFR, Estimated: >60 mL/min (ref >60)
Glucose, Bld: 129 mg/dL — ABNORMAL HIGH (ref 70–99)
Potassium: 4.1 mmol/L (ref 3.5–5.1)
Sodium: 135 mmol/L (ref 135–145)
Total Bilirubin: 0.4 mg/dL (ref 0.3–1.2)
Total Protein: 6.2 g/dL — ABNORMAL LOW (ref 6.5–8.1)

## 2020-11-30 LAB — URINALYSIS, ROUTINE W REFLEX MICROSCOPIC
Bacteria, UA: NONE SEEN
Bilirubin Urine: NEGATIVE
Glucose, UA: 50 mg/dL — AB
Hgb urine dipstick: NEGATIVE
Ketones, ur: NEGATIVE mg/dL
Nitrite: NEGATIVE
Protein, ur: NEGATIVE mg/dL
Specific Gravity, Urine: 1.011 (ref 1.005–1.030)
pH: 7 (ref 5.0–8.0)

## 2020-11-30 LAB — CBC WITH DIFFERENTIAL/PLATELET
Abs Immature Granulocytes: 0.08 10*3/uL — ABNORMAL HIGH (ref 0.00–0.07)
Basophils Absolute: 0 10*3/uL (ref 0.0–0.1)
Basophils Relative: 0 %
Eosinophils Absolute: 0.2 10*3/uL (ref 0.0–0.5)
Eosinophils Relative: 2 %
HCT: 33.2 % — ABNORMAL LOW (ref 39.0–52.0)
Hemoglobin: 11.1 g/dL — ABNORMAL LOW (ref 13.0–17.0)
Immature Granulocytes: 1 %
Lymphocytes Relative: 8 %
Lymphs Abs: 0.9 10*3/uL (ref 0.7–4.0)
MCH: 27.8 pg (ref 26.0–34.0)
MCHC: 33.4 g/dL (ref 30.0–36.0)
MCV: 83 fL (ref 80.0–100.0)
Monocytes Absolute: 0.5 10*3/uL (ref 0.1–1.0)
Monocytes Relative: 5 %
Neutro Abs: 9.2 10*3/uL — ABNORMAL HIGH (ref 1.7–7.7)
Neutrophils Relative %: 84 %
Platelets: 288 10*3/uL (ref 150–400)
RBC: 4 MIL/uL — ABNORMAL LOW (ref 4.22–5.81)
RDW: 15.4 % (ref 11.5–15.5)
WBC: 10.9 10*3/uL — ABNORMAL HIGH (ref 4.0–10.5)
nRBC: 0 % (ref 0.0–0.2)

## 2020-11-30 LAB — LACTIC ACID, PLASMA
Lactic Acid, Venous: 2 mmol/L (ref 0.5–1.9)
Lactic Acid, Venous: 1.2 mmol/L (ref 0.5–1.9)

## 2020-11-30 LAB — APTT: aPTT: 36 seconds (ref 24–36)

## 2020-11-30 LAB — PROCALCITONIN: Procalcitonin: 0.7 ng/mL

## 2020-11-30 LAB — PROTIME-INR
INR: 1.1 (ref 0.8–1.2)
Prothrombin Time: 14 seconds (ref 11.4–15.2)

## 2020-11-30 MED ORDER — DIPHENHYDRAMINE HCL 50 MG/ML IJ SOLN
25.0000 mg | Freq: Three times a day (TID) | INTRAMUSCULAR | Status: DC | PRN
  Administered 2020-11-30: 25 mg via INTRAVENOUS
  Filled 2020-11-30 (×2): qty 1

## 2020-11-30 MED ORDER — VANCOMYCIN HCL 750 MG/150ML IV SOLN: 750.0000 mg | INTRAVENOUS | Status: DC

## 2020-11-30 MED ORDER — CEFEPIME HCL 2 G IJ SOLR
2.0000 g | Freq: Three times a day (TID) | INTRAMUSCULAR | Status: DC
  Administered 2020-11-30 – 2020-12-04 (×13): 2 g via INTRAVENOUS
  Filled 2020-11-30 (×15): qty 2

## 2020-11-30 NOTE — Progress Notes (Signed)
Pharmacy Antibiotic Note  Gary Warren is a 20 y.o. male admitted on 10/18/2020, now with concern for sepsis.  Pharmacy has been consulted for vancomycin and cefepime dosing.  Plan: Vancomycin 1000mg  x1 then 750mg  IV Q24H. Goal AUC 400-550.  Expected AUC 500.  SCr used 0.8 (actual SCr 0.42).  Cefepme 2g IV Q8H. Started on metronidazole by MD.  Height: 4\' 6"  (137.2 cm) Weight: 33.6 kg (74 lb) IBW/kg (Calculated) : 36.2  Temp (24hrs), Avg:99.7 F (37.6 C), Min:98 F (36.7 C), Max:100.7 F (38.2 C)  Recent Labs  Lab 11/25/20 0346 11/29/20 0320  WBC 8.5  --   CREATININE 0.45* 0.43*    Estimated Creatinine Clearance: 70 mL/min (A) (by C-G formula based on SCr of 0.43 mg/dL (L)).    No Known Allergies   Thank you for allowing pharmacy to be a part of this patient's care.  , PharmD, BCPS  11/30/2020 12:04 AM

## 2020-11-30 NOTE — Progress Notes (Signed)
   11/29/20 2330  Assess: MEWS Score  Temp (!) 100.6 F (38.1 C)  BP (!) 85/45  Pulse Rate (!) 112  Resp 14  Level of Consciousness Alert  SpO2 99 %  O2 Device Room Air  Assess: MEWS Score  MEWS Temp 1  MEWS Systolic 1  MEWS Pulse 2  MEWS RR 0  MEWS LOC 0  MEWS Score 4  MEWS Score Color Red  Assess: if the MEWS score is Yellow or Red  Were vital signs taken at a resting state? Yes  Focused Assessment Change from prior assessment (see assessment flowsheet)  Early Detection of Sepsis Score *See Row Information* Low  MEWS guidelines implemented *See Row Information* Yes  Treat  MEWS Interventions Administered scheduled meds/treatments  Take Vital Signs  Increase Vital Sign Frequency  Red: Q 1hr X 4 then Q 4hr X 4, if remains red, continue Q 4hrs  Notify: Charge Nurse/RN  Name of Charge Nurse/RN Notified Jonie Burdell  Date Charge Nurse/RN Notified 11/29/20  Time Charge Nurse/RN Notified 2335  Notify: Provider  Provider Name/Title V. Rathore  Date Provider Notified 11/29/20  Time Provider Notified 2340  Notification Type Page  Notification Reason Change in status  Provider response See new orders  Date of Provider Response 11/29/20  Time of Provider Response 0000  Notify: Rapid Response  Name of Rapid Response RN Notified Ronne Binning  Date Rapid Response Notified 11/29/20  Time Rapid Response Notified 2350  Document  Patient Outcome Stabilized after interventions  Progress note created (see row info) Yes
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
## 2020-11-30 NOTE — Code Documentation (Signed)
Responded to code sepsis. I placed 2 PIV and helped hang IV fluids.   Addendum @0145 : Called back to bedside, patient is itching all over and appears to have welts on skin- RN is concerned about allergic reaction to vanc. RN stopped vanc PTA. Lung sounds are clear and he has no stridor. VSS. Encouraged RN to call MD for further orders.

## 2020-11-30 NOTE — Progress Notes (Signed)
Sepsis tracking by eLINK 

## 2020-12-01 DIAGNOSIS — K5649 Other impaction of intestine: Secondary | ICD-10-CM | POA: Diagnosis not present

## 2020-12-01 MED ORDER — SODIUM CHLORIDE 0.9 % IV BOLUS
500.0000 mL | Freq: Once | INTRAVENOUS | Status: AC
  Administered 2020-12-01: 500 mL via INTRAVENOUS

## 2020-12-01 NOTE — Plan of Care (Signed)
  Problem: Health Behavior/Discharge Planning: Goal: Ability to manage health-related needs will improve Outcome: Progressing   
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
## 2020-12-02 DIAGNOSIS — K5649 Other impaction of intestine: Secondary | ICD-10-CM | POA: Diagnosis not present

## 2020-12-02 MED ORDER — SODIUM CHLORIDE 0.9 % IV SOLN
INTRAVENOUS | Status: DC | PRN
  Administered 2020-12-02 – 2020-12-04 (×4): 250 mL via INTRAVENOUS
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
## 2020-12-03 DIAGNOSIS — K5649 Other impaction of intestine: Secondary | ICD-10-CM | POA: Diagnosis not present

## 2020-12-03 LAB — BASIC METABOLIC PANEL
Anion gap: 8 (ref 5–15)
BUN: 16 mg/dL (ref 6–20)
CO2: 26 mmol/L (ref 22–32)
Calcium: 9.5 mg/dL (ref 8.9–10.3)
Chloride: 104 mmol/L (ref 98–111)
Creatinine, Ser: 0.52 mg/dL — ABNORMAL LOW (ref 0.61–1.24)
GFR, Estimated: >60 mL/min (ref >60)
Glucose, Bld: 96 mg/dL (ref 70–99)
Potassium: 4.2 mmol/L (ref 3.5–5.1)
Sodium: 138 mmol/L (ref 135–145)
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
## 2020-12-03 NOTE — Plan of Care (Signed)
  Problem: Health Behavior/Discharge Planning: Goal: Ability to manage health-related needs will improve Outcome: Progressing   

## 2020-12-03 NOTE — Progress Notes (Signed)
Pharmacy Antibiotic Note  Gary Warren is a 20 y.o. male admitted on 10/18/2020, now with concern for sepsis. Patient originally treated with vancomycin/cefepime/flagyl, however patient experienced symptoms consistent with Red Man's Syndrome, which resolved after the vancomycin infusion was stopped. Patient has remained afebrile for several days now, no recent WBC count, Renal function stable (Scr 0.52). Dosing antibiotics based on Scr 0.8 as patient has very low muscle mass/malnourishment.  Pharmacy has been consulted for cefepime and flagyl dosing.   Plan: - CONTINUE Cefepime 2g IV Q8H  - CONTINUE Flagyl 500 mg IV Q12H  - Follow-up renal function, LOT, cultures/sensitivities   Height: 4\' 6"  (137.2 cm) Weight: 33.6 kg (74 lb) IBW/kg (Calculated) : 36.2  Temp (24hrs), Avg:97.9 F (36.6 C), Min:97.7 F (36.5 C), Max:98.4 F (36.9 C)  Recent Labs  Lab 11/29/20 0320 11/30/20 0031 11/30/20 0424 12/03/20 0336  WBC  --  10.9*  --   --   CREATININE 0.43* 0.55*  --  0.52*  LATICACIDVEN  --  2.0* 1.2  --      Estimated Creatinine Clearance: 70 mL/min (A) (by C-G formula based on SCr of 0.52 mg/dL (L)).    Allergies  Allergen Reactions   Vancomycin Hives    Antimicrobials this admission:  Vanc 10/13 x 1 Cefepime 10/13 >> Flagyl 10/13 >>  Microbiology:  10/13 Bcx: ngtd   11/13, PharmD PGY-1 Acute Care Resident  12/03/2020 9:34 AM

## 2020-12-04 MED ORDER — CEFEPIME HCL 1 G IJ SOLR
1.0000 g | Freq: Three times a day (TID) | INTRAMUSCULAR | Status: DC
  Administered 2020-12-04 – 2020-12-05 (×3): 1 g via INTRAVENOUS
  Filled 2020-12-04 (×4): qty 1

## 2020-12-04 NOTE — Progress Notes (Signed)
Pharmacy Antibiotic Note  Gary Warren is a 21 y.o. male admitted on 10/18/2020, now with concern for sepsis. Patient originally treated with vancomycin/cefepime/flagyl, however patient experienced symptoms consistent with Red Man's Syndrome, which resolved after the vancomycin infusion was stopped. Patient has remained afebrile for several days now, no recent WBC count, Renal function stable (Scr 0.52). Dosing antibiotics based on Scr 0.8 as patient has very low muscle mass/malnourishment.  Pharmacy has been consulted for cefepime and flagyl dosing.   Scr stable but we will reduce dose of cefepime due to low body weight. Add stop date of 7d  Plan: - Change Cefepime 1g IV Q8H  - CONTINUE Flagyl 500 mg IV Q12H  Rx will sign off  Height: 4\' 6"  (137.2 cm) Weight: 33.6 kg (74 lb) IBW/kg (Calculated) : 36.2  Temp (24hrs), Avg:98.3 F (36.8 C), Min:97.7 F (36.5 C), Max:98.9 F (37.2 C)  Recent Labs  Lab 11/29/20 0320 11/30/20 0031 11/30/20 0424 12/03/20 0336  WBC  --  10.9*  --   --   CREATININE 0.43* 0.55*  --  0.52*  LATICACIDVEN  --  2.0* 1.2  --      Estimated Creatinine Clearance: 70 mL/min (A) (by C-G formula based on SCr of 0.52 mg/dL (L)).    Allergies  Allergen Reactions   Vancomycin Hives    Antimicrobials this admission:  Vanc 10/13 x 1 Cefepime 10/13 >>10/19 Flagyl 10/13 >>10/19  Microbiology:  10/13 Bcx: ngtd    11/13, PharmD, BCIDP, AAHIVP, CPP Infectious Disease Pharmacist 12/04/2020 12:41 PM
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
## 2020-12-05 LAB — CULTURE, BLOOD (ROUTINE X 2)
Culture: NO GROWTH
Culture: NO GROWTH
Special Requests: ADEQUATE
Special Requests: ADEQUATE
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
## 2020-12-05 NOTE — Progress Notes (Signed)
Pt has gotten 6 days of vanc/cefepime. Ok to stop abx per Dr Lucianne Muss.  Ulyses Southward, PharmD, BCIDP, AAHIVP, CPP Infectious Disease Pharmacist 12/05/2020 9:34 AM
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
## 2020-12-06 DIAGNOSIS — K5900 Constipation, unspecified: Secondary | ICD-10-CM

## 2020-12-06 NOTE — Plan of Care (Signed)
  Problem: Health Behavior/Discharge Planning: Goal: Ability to manage health-related needs will improve Outcome: Progressing   

## 2020-12-06 NOTE — Progress Notes (Signed)
CSW spoke with [name] at [facility] who states she is currently completing applications for [facility] and [facility] to obtain placement in an intermediate care facility (ICF). [name] will provide CSW with updates as they become available.  Edwin Dada, MSW, LCSW Transitions of Care  Clinical Social Worker II [phone]
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
## 2020-12-07 MED ORDER — ZINC OXIDE 40 % EX OINT
TOPICAL_OINTMENT | Freq: Four times a day (QID) | CUTANEOUS | Status: DC | PRN
  Filled 2020-12-07: qty 57
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
## 2020-12-08 DIAGNOSIS — K5649 Other impaction of intestine: Secondary | ICD-10-CM | POA: Diagnosis not present
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
## 2020-12-08 NOTE — Progress Notes (Addendum)
11:20am: CSW spoke with Sander Radon of [facility] to discuss patient.  CSW informed [name] that the patient cannot consent or sign documentation for himself due to the inability for staff to assess his orientation or capacity.  9:50am: CSW attempted to reach Ignacia Bayley at [facility] without success - a voicemail was left requesting a return call.  CSW spoke with [name] at [facility] to answer questions to assist with applications for placement.  Edwin Dada, MSW, LCSW Transitions of Care  Clinical Social Worker II [phone]

## 2020-12-08 NOTE — Plan of Care (Signed)
  Problem: Health Behavior/Discharge Planning: Goal: Ability to manage health-related needs will improve Outcome: Progressing   

## 2020-12-09 NOTE — Plan of Care (Signed)
  Problem: Health Behavior/Discharge Planning: Goal: Ability to manage health-related needs will improve Outcome: Progressing   
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
## 2020-12-12 DIAGNOSIS — R6250 Unspecified lack of expected normal physiological development in childhood: Secondary | ICD-10-CM | POA: Diagnosis present

## 2020-12-12 DIAGNOSIS — R5381 Other malaise: Secondary | ICD-10-CM | POA: Diagnosis present
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
## 2020-12-15 NOTE — Progress Notes (Signed)
CSW spoke with Shawna Orleans at [facility] who states the court date for interim guardianship is 12/21/2020.  Edwin Dada, MSW, LCSW Transitions of Care  Clinical Social Worker II [phone]
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
## 2020-12-15 NOTE — Plan of Care (Signed)
  Problem: Health Behavior/Discharge Planning: Goal: Ability to manage health-related needs will improve Outcome: Progressing   
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
## 2020-12-17 NOTE — Plan of Care (Signed)
  Problem: Health Behavior/Discharge Planning: Goal: Ability to manage health-related needs will improve Outcome: Progressing   

## 2020-12-18 LAB — BASIC METABOLIC PANEL
Anion gap: 6 (ref 5–15)
BUN: 16 mg/dL (ref 6–20)
CO2: 26 mmol/L (ref 22–32)
Calcium: 9.4 mg/dL (ref 8.9–10.3)
Chloride: 106 mmol/L (ref 98–111)
Creatinine, Ser: 0.42 mg/dL — ABNORMAL LOW (ref 0.61–1.24)
GFR, Estimated: >60 mL/min (ref >60)
Glucose, Bld: 100 mg/dL — ABNORMAL HIGH (ref 70–99)
Potassium: 4 mmol/L (ref 3.5–5.1)
Sodium: 138 mmol/L (ref 135–145)
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
## 2020-12-19 NOTE — Plan of Care (Signed)
  Problem: Health Behavior/Discharge Planning: Goal: Ability to manage health-related needs will improve Outcome: Progressing   
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
## 2020-12-20 NOTE — Progress Notes (Signed)
CSW participated in court hearing regarding interim guardianship of the patient.  [facility] DSS was appointed as interim guardian of the patient and the patient has been deemed incompetent.  Edwin Dada, MSW, LCSW Transitions of Care  Clinical Social Worker II [phone]

## 2020-12-20 NOTE — Plan of Care (Signed)
  Problem: Health Behavior/Discharge Planning: Goal: Ability to manage health-related needs will improve Outcome: Progressing   
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
## 2020-12-20 NOTE — Plan of Care (Signed)
  Problem: Health Behavior/Discharge Planning: Goal: Ability to manage health-related needs will improve Outcome: Adequate for Discharge   

## 2020-12-21 MED ORDER — WHITE PETROLATUM EX OINT
TOPICAL_OINTMENT | CUTANEOUS | Status: AC
  Filled 2020-12-21: qty 28.35
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
## 2020-12-21 NOTE — Progress Notes (Signed)
CSW notified patient's care coordinator [name] at [place] of results from yesterday's court hearing.  DSS will now be able to consent for placement on the patient's behalf.  Edwin Dada, MSW, LCSW Transitions of Care  Clinical Social Worker II [phone]
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
## 2020-12-22 NOTE — Plan of Care (Signed)
  Problem: Health Behavior/Discharge Planning: Goal: Ability to manage health-related needs will improve Outcome: Progressing   

## 2020-12-23 NOTE — Progress Notes (Signed)
Patient is A&O x 1 and dependent for all care. No S/S of pain or distress. Appetite good, encouraged fluids. Patient up and out of room today. Patient walked by staff. Personal items within reach. Safety maintained.
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
## 2020-12-24 DIAGNOSIS — R6250 Unspecified lack of expected normal physiological development in childhood: Secondary | ICD-10-CM

## 2020-12-24 DIAGNOSIS — R5381 Other malaise: Secondary | ICD-10-CM
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
## 2020-12-26 NOTE — Plan of Care (Signed)
  Problem: Health Behavior/Discharge Planning: Goal: Ability to manage health-related needs will improve Outcome: Adequate for Discharge   
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
## 2020-12-27 NOTE — Progress Notes (Signed)
CSW spoke with patient's interim legal guardian Lanae who states [name] at [facility] has resumed her search for ICF placement options at this time. Lanae is requesting a note from psychiatry team to state the barriers to completing a full psychological assessment on the patient - specifically his non-verbal status.  CSW notified NP of request so an order can be placed.  Edwin Dada, MSW, LCSW Transitions of Care  Clinical Social Worker II [phone]
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
## 2020-12-29 LAB — BASIC METABOLIC PANEL
Anion gap: 8 (ref 5–15)
BUN: 12 mg/dL (ref 6–20)
CO2: 25 mmol/L (ref 22–32)
Calcium: 9.6 mg/dL (ref 8.9–10.3)
Chloride: 103 mmol/L (ref 98–111)
Creatinine, Ser: 0.39 mg/dL — ABNORMAL LOW (ref 0.61–1.24)
GFR, Estimated: >60 mL/min (ref >60)
Glucose, Bld: 124 mg/dL — ABNORMAL HIGH (ref 70–99)
Potassium: 4.2 mmol/L (ref 3.5–5.1)
Sodium: 136 mmol/L (ref 135–145)
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
## 2020-12-30 LAB — CBC WITH DIFFERENTIAL/PLATELET
Abs Immature Granulocytes: 0.03 10*3/uL (ref 0.00–0.07)
Basophils Absolute: 0 10*3/uL (ref 0.0–0.1)
Basophils Relative: 1 %
Eosinophils Absolute: 0.1 10*3/uL (ref 0.0–0.5)
Eosinophils Relative: 2 %
HCT: 39.4 % (ref 39.0–52.0)
Hemoglobin: 12.9 g/dL — ABNORMAL LOW (ref 13.0–17.0)
Immature Granulocytes: 1 %
Lymphocytes Relative: 42 %
Lymphs Abs: 2.5 10*3/uL (ref 0.7–4.0)
MCH: 25.9 pg — ABNORMAL LOW (ref 26.0–34.0)
MCHC: 32.7 g/dL (ref 30.0–36.0)
MCV: 79.1 fL — ABNORMAL LOW (ref 80.0–100.0)
Monocytes Absolute: 0.7 10*3/uL (ref 0.1–1.0)
Monocytes Relative: 12 %
Neutro Abs: 2.5 10*3/uL (ref 1.7–7.7)
Neutrophils Relative %: 42 %
Platelets: 337 10*3/uL (ref 150–400)
RBC: 4.98 MIL/uL (ref 4.22–5.81)
RDW: 14.2 % (ref 11.5–15.5)
WBC: 5.9 10*3/uL (ref 4.0–10.5)
nRBC: 0 % (ref 0.0–0.2)
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
## 2020-12-30 NOTE — Plan of Care (Signed)
  Problem: Health Behavior/Discharge Planning: Goal: Ability to manage health-related needs will improve Outcome: Progressing   
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
## 2021-01-01 DIAGNOSIS — F79 Unspecified intellectual disabilities: Secondary | ICD-10-CM | POA: Diagnosis not present
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
## 2021-01-01 NOTE — Progress Notes (Signed)
CSW sent recent psych note to [facility], care coordinator at [facility] for review.  Edwin Dada, MSW, LCSW Transitions of Care  Clinical Social Worker II [phone]
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
## 2021-01-02 NOTE — Plan of Care (Signed)
  Problem: Health Behavior/Discharge Planning: Goal: Ability to manage health-related needs will improve Outcome: Not Progressing   

## 2021-01-02 NOTE — Plan of Care (Signed)
  Problem: Health Behavior/Discharge Planning: Goal: Ability to manage health-related needs will improve 01/02/2021 0738 by Denton Meek, RN Outcome: Progressing 01/02/2021 0135 by Denton Meek, RN Outcome: Progressing
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
## 2021-01-03 NOTE — Plan of Care (Signed)
  Problem: Health Behavior/Discharge Planning: Goal: Ability to manage health-related needs will improve Outcome: Progressing   
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
## 2021-01-05 MED ORDER — BENZOCAINE 10 % MT GEL
Freq: Four times a day (QID) | OROMUCOSAL | Status: DC | PRN
  Filled 2021-01-05: qty 9
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
## 2021-01-09 NOTE — Progress Notes (Signed)
CSW spoke with patient's care coordinator at [facility] who states referrals have been made to [facility], [facility], and [facility].  Edwin Dada, MSW, LCSW Transitions of Care  Clinical Social Worker II [phone]
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
## 2021-01-11 NOTE — Progress Notes (Signed)
Patient stable await placement  No charge  Pleas Koch, MD [facility] 10:46 AM

## 2021-01-12 NOTE — Progress Notes (Signed)
No new changes Stable await placement  No charge  Pleas Koch, MD [facility] 1:52 PM

## 2021-01-13 NOTE — Progress Notes (Signed)
Playin at Nursing station--no distress  Await placement  Pleas Koch, MD [facility] 2:52 PM

## 2021-01-14 LAB — CBC WITH DIFFERENTIAL/PLATELET
Abs Immature Granulocytes: 0.02 10*3/uL (ref 0.00–0.07)
Basophils Absolute: 0 10*3/uL (ref 0.0–0.1)
Basophils Relative: 1 %
Eosinophils Absolute: 0.1 10*3/uL (ref 0.0–0.5)
Eosinophils Relative: 2 %
HCT: 38.9 % — ABNORMAL LOW (ref 39.0–52.0)
Hemoglobin: 12.9 g/dL — ABNORMAL LOW (ref 13.0–17.0)
Immature Granulocytes: 0 %
Lymphocytes Relative: 48 %
Lymphs Abs: 2.6 10*3/uL (ref 0.7–4.0)
MCH: 25.5 pg — ABNORMAL LOW (ref 26.0–34.0)
MCHC: 33.2 g/dL (ref 30.0–36.0)
MCV: 77 fL — ABNORMAL LOW (ref 80.0–100.0)
Monocytes Absolute: 0.6 10*3/uL (ref 0.1–1.0)
Monocytes Relative: 11 %
Neutro Abs: 2 10*3/uL (ref 1.7–7.7)
Neutrophils Relative %: 38 %
Platelets: 398 10*3/uL (ref 150–400)
RBC: 5.05 MIL/uL (ref 4.22–5.81)
RDW: 14.3 % (ref 11.5–15.5)
WBC: 5.3 10*3/uL (ref 4.0–10.5)
nRBC: 0 % (ref 0.0–0.2)

## 2021-01-14 LAB — COMPREHENSIVE METABOLIC PANEL
ALT: 24 U/L (ref 0–44)
AST: 30 U/L (ref 15–41)
Albumin: 3.4 g/dL — ABNORMAL LOW (ref 3.5–5.0)
Alkaline Phosphatase: 275 U/L — ABNORMAL HIGH (ref 38–126)
Anion gap: 8 (ref 5–15)
BUN: 14 mg/dL (ref 6–20)
CO2: 24 mmol/L (ref 22–32)
Calcium: 9.2 mg/dL (ref 8.9–10.3)
Chloride: 104 mmol/L (ref 98–111)
Creatinine, Ser: 0.53 mg/dL — ABNORMAL LOW (ref 0.61–1.24)
GFR, Estimated: >60 mL/min (ref >60)
Glucose, Bld: 103 mg/dL — ABNORMAL HIGH (ref 70–99)
Potassium: 4.1 mmol/L (ref 3.5–5.1)
Sodium: 136 mmol/L (ref 135–145)
Total Bilirubin: 0.6 mg/dL (ref 0.3–1.2)
Total Protein: 6.4 g/dL — ABNORMAL LOW (ref 6.5–8.1)
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
## 2021-01-15 NOTE — Progress Notes (Addendum)
CSW spoke with [name] at [facility] to assist with completing a page of the referral form for [facility]. [name] provided CSW with list of ICF facilities to contact for placement.  [name] [facility] - left [name] - left voicemail [name] - no beds [name] - no beds [name] - no beds [name] - voicemail [name] - There is a Designer, industrial/product so CSW notified [employer] of update.  Edwin Dada, MSW, LCSW Transitions of Care  Clinical Social Worker II [phone]

## 2021-01-16 NOTE — Progress Notes (Addendum)
12pm: CSW spoke with patient's care coordinator who states the agency [facility] wants to assess the patient via Webex on Thursday 12/1 at 1pm.  CSW spoke with RN Candice to inform her of information - bedside RN that day will facilitate call.  CSW spoke with Lebron Conners who states she will participate in the Webex assessment.  8:30am: CSW contacted the following facilities to determine bed availability:  [facility] - left voicemail [facility] - disconnected number [facility] - left voicemail [facility] [place] - There is a vacancy so CSW notified Gaffer of update. [facility] - no beds  [facility] - no beds [facility] - no answer, no voicemail option [facility] - incorrect number [facility] - no voicemail option  Edwin Dada, MSW, LCSW Transitions of Care  Clinical Social Worker II [phone]
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
## 2021-01-17 NOTE — Progress Notes (Addendum)
CSW received Webex invitation for assessment meeting for tomorrow at 1pm. CSW spoke with Eugenie Norrie, RN regarding [employer] - information for meeting forwarded to RN.  CSW spoke with [name], care coordinator to provide her with an update for tomorrow's meeting.  Edwin Dada, MSW, LCSW Transitions of Care  Clinical Social Worker II [phone]

## 2021-01-18 NOTE — Progress Notes (Signed)
CSW spoke with Eugenie Norrie, RN and Albin Felling, RN who assisted patient with assessment. Assessment was completed with legal guardian present.   Edwin Dada, MSW, LCSW Transitions of Care  Clinical Social Worker II [phone]
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
## 2021-01-20 NOTE — Plan of Care (Signed)
  Problem: Health Behavior/Discharge Planning: Goal: Ability to manage health-related needs will improve Outcome: Progressing   

## 2021-01-21 NOTE — Plan of Care (Signed)
  Problem: Health Behavior/Discharge Planning: Goal: Ability to manage health-related needs will improve Outcome: Progressing   
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
## 2021-01-22 NOTE — Plan of Care (Signed)
  Problem: Health Behavior/Discharge Planning: Goal: Ability to manage health-related needs will improve Outcome: Progressing   

## 2021-01-23 NOTE — Progress Notes (Signed)
CSW spoke with Jennette Kettle of [facility] who states the patient's case has been transferred to her. Dendra states she intends to contact the patient's mother to inform her of the possibility that the patient may be placed in [place]. Dendra states [facility] does not currently have a bed but they are scheduled to have an opening this month.  Edwin Dada, MSW, LCSW Transitions of Care  Clinical Social Worker II [phone]
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
## 2021-01-27 NOTE — Plan of Care (Signed)
  Problem: Health Behavior/Discharge Planning: Goal: Ability to manage health-related needs will improve Outcome: Progressing   

## 2021-01-27 NOTE — Plan of Care (Signed)
  Problem: Health Behavior/Discharge Planning: Goal: Ability to manage health-related needs will improve Outcome: Not Progressing   

## 2021-01-27 NOTE — Progress Notes (Signed)
Overall doing well no distress seen in the bed Interactive at times and has been playing with staff members whenever he is given the option to be an sitting at the nursing station  Awake placement at this time  No charge  Pleas Koch, MD [facility] 2:13 PM
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
## 2021-01-29 LAB — COMPREHENSIVE METABOLIC PANEL
ALT: 21 U/L (ref 0–44)
AST: 26 U/L (ref 15–41)
Albumin: 3.6 g/dL (ref 3.5–5.0)
Alkaline Phosphatase: 321 U/L — ABNORMAL HIGH (ref 38–126)
Anion gap: 8 (ref 5–15)
BUN: 15 mg/dL (ref 6–20)
CO2: 24 mmol/L (ref 22–32)
Calcium: 9.4 mg/dL (ref 8.9–10.3)
Chloride: 106 mmol/L (ref 98–111)
Creatinine, Ser: 0.51 mg/dL — ABNORMAL LOW (ref 0.61–1.24)
GFR, Estimated: >60 mL/min (ref >60)
Glucose, Bld: 98 mg/dL (ref 70–99)
Potassium: 4.4 mmol/L (ref 3.5–5.1)
Sodium: 138 mmol/L (ref 135–145)
Total Bilirubin: 0.4 mg/dL (ref 0.3–1.2)
Total Protein: 6.4 g/dL — ABNORMAL LOW (ref 6.5–8.1)
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
## 2021-01-29 NOTE — Plan of Care (Signed)
  Problem: Health Behavior/Discharge Planning: Goal: Ability to manage health-related needs will improve 01/29/2021 0014 by Zara Chess, RN Outcome: Not Progressing 01/29/2021 0012 by Zara Chess, RN Outcome: Progressing
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
## 2021-01-29 NOTE — Progress Notes (Addendum)
CSW spoke with Jennette Kettle at [place] [facility] who states she spoke with Archer Asa of [facility] last week regarding proceeding with placement at the facility in [place]. Dendra stated [facility] plans to visit the patient to complete and in person assessment this week.  CSW spoke with Effie Berkshire via e-mail to determine when the scheduled visit was. Effie Berkshire stated the visit will occur tomorrow, 01/30/21 at some time before noon - Clinical Supervisor Daria Pastures of the home and the Site Supervisor Rosine Beat will be present.  Edwin Dada, MSW, LCSW Transitions of Care  Clinical Social Worker II [phone]
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
## 2021-01-30 NOTE — Plan of Care (Signed)
  Problem: Health Behavior/Discharge Planning: Goal: Ability to manage health-related needs will improve Outcome: Progressing   

## 2021-01-30 NOTE — Progress Notes (Addendum)
CSW met with patient, unit staff, and Kennyth Lose and [facility] to complete assessment on patient.   [facility] will speak with their administration to communicate their approval of the patient. A bed will be available for the patient in approximately 2 weeks or less.   CSW notified NP of update.  CSW spoke with patient's legal guardian Dendra who states she is in agreement with placement at the [facility] facility. Dendra states she has not been able to communicate with patient's mother as she has not returned the phone calls Audria Nine has made.  Madilyn Fireman, MSW, LCSW Transitions of Care   Clinical Social Worker II [phone]

## 2021-01-31 NOTE — Plan of Care (Signed)
  Problem: Health Behavior/Discharge Planning: Goal: Ability to manage health-related needs will improve Outcome: Progressing   
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
## 2021-02-01 NOTE — Progress Notes (Signed)
CSW completed Level of Care forms, had Dr. Hanley Ben sign them, and returned them to [facility] at [facility] for review.  Edwin Dada, MSW, LCSW Transitions of Care   Clinical Social Worker II [phone]

## 2021-02-02 ENCOUNTER — Other Ambulatory Visit (HOSPITAL_COMMUNITY): Payer: Self-pay

## 2021-02-02 MED ORDER — ACETAMINOPHEN 325 MG PO TABS
325.0000 mg | ORAL_TABLET | Freq: Four times a day (QID) | ORAL | 12 refills | Status: DC | PRN
Start: 2021-02-02 — End: 2021-02-06
  Filled 2021-02-02: qty 60, 15d supply, fill #0

## 2021-02-02 MED ORDER — POLYETHYLENE GLYCOL 3350 17 GM/SCOOP PO POWD
17.0000 g | Freq: Every day | ORAL | 0 refills | Status: DC
  Filled 2021-02-02: qty 238, 14d supply, fill #0

## 2021-02-02 MED ORDER — CERTAVITE/ANTIOXIDANTS PO TABS
1.0000 | ORAL_TABLET | Freq: Every day | ORAL | 12 refills | Status: DC
  Filled 2021-02-02: qty 30, 30d supply, fill #0

## 2021-02-02 MED ORDER — ENSURE ENLIVE PO LIQD
237.0000 mL | Freq: Three times a day (TID) | ORAL | 12 refills | Status: DC
  Filled 2021-02-02: qty 237, 1d supply, fill #0
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
## 2021-02-02 NOTE — Progress Notes (Addendum)
12:30pm: CSW received call from Jennette Kettle at DSS who states the admissions paperwork has been completed. Transportation details are being worked out between [employer] and [employer].  CSW notified Eugenie Norrie, RN of discharge plan.  CSW notified [name] at [facility] of discharge plan.  CSW notified Revonda Standard, NP of discharge plan and requested she send all medications to [facility] pharmacy to be filled prior to discharge.  10am: CSW spoke with [facility] of [facility] who is requesting an updated medication list be sent. CSW sent updated medication list via secure e-mail.  CSW received e-mail from patient's care coordinator at [facility] who states the LOC documentation was approved.  Edwin Dada, MSW, LCSW Transitions of Care   Clinical Social Worker II [phone]
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
## 2021-02-05 NOTE — Plan of Care (Signed)
  Problem: Health Behavior/Discharge Planning: Goal: Ability to manage health-related needs will improve Outcome: Adequate for Discharge   
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
## 2021-02-05 NOTE — Progress Notes (Addendum)
10:45am: CSW spoke with patient's legal guardian Dendra who states patient's mother is agreeable to discharge plan and she plans on visiting today before discharge tomorrow.  8:45am: CSW spoke with Annice Pih of [facility] who states the agency will provide patient's transportation tomorrow - the expected time of their arrival is noon.  Edwin Dada, MSW, LCSW Transitions of Care   Clinical Social Worker II [phone]

## 2021-02-05 NOTE — Plan of Care (Signed)
  Problem: Health Behavior/Discharge Planning: Goal: Ability to manage health-related needs will improve Outcome: Progressing   

## 2021-02-06 ENCOUNTER — Other Ambulatory Visit (HOSPITAL_COMMUNITY): Payer: Self-pay

## 2021-02-06 MED ORDER — ENSURE ENLIVE PO LIQD: 237.0000 mL | Freq: Three times a day (TID) | ORAL | 12 refills | Status: AC

## 2021-02-06 MED ORDER — ACETAMINOPHEN 325 MG PO TABS: 325.0000 mg | ORAL_TABLET | Freq: Four times a day (QID) | ORAL | 12 refills | Status: AC | PRN

## 2021-02-06 MED ORDER — POLYETHYLENE GLYCOL 3350 17 GM/SCOOP PO POWD: 17.0000 g | Freq: Every day | ORAL | 0 refills | Status: AC

## 2021-02-06 MED ORDER — CERTAVITE/ANTIOXIDANTS PO TABS: 1.0000 | ORAL_TABLET | Freq: Every day | ORAL | 12 refills | Status: AC

## 2021-02-06 NOTE — TOC Progression Note (Signed)
Transition of Care [facility]) - Progression Note    Patient Details  Name: JANES COLEGROVE MRN: [id number] Date of Birth: [birth year]  Transition of Care [facility]) CM/SW Contact  Janae Bridgeman, RN Phone Number: 02/06/2021, 9:27 AM  Clinical Narrative:    CM spoke with Edwin Dada, CM and Junious Silk, NP and the patient was will be discharged to an [facility] facility at 12 noon today and will be transported by the home by car.  DME orders were placed and co-signed by Rennis Harding, NP and wheelchair will be delivered to the patient's room prior to discharge.  Discharge summary and clinicals will be provided to the facility prior to transport.   Expected Discharge Plan: Long Term Nursing Home Barriers to Discharge: No Barriers Identified  Expected Discharge Plan and Services Expected Discharge Plan: Long Term Nursing Home In-house Referral: Clinical Social Work Discharge Planning Services: CM Consult Post Acute Care Choice: Durable Medical Equipment Living arrangements for the past 2 months: Single Family Home                 DME Arranged: Community education officer wheelchair with seat cushion DME Agency: [facility] Date DME Agency Contacted: 02/06/21 Time DME Agency Contacted: [phone] Representative spoke with at DME Agency: Zack, CM with [employer]             Social Determinants of Health (SDOH) Interventions    Readmission Risk Interventions No flowsheet data found.

## 2021-02-06 NOTE — Plan of Care (Signed)
  Problem: Health Behavior/Discharge Planning: Goal: Ability to manage health-related needs will improve Outcome: Progressing   

## 2021-02-06 NOTE — Progress Notes (Signed)
DISCHARGE NOTE HOME [name] to be discharged  [facility]  per MD order. Discussed prescriptions and follow up appointments with the patient. Prescriptions given to patient; medication list explained in detail. Patient verbalized understanding.  Skin clean, dry and intact without evidence of skin break down, no evidence of skin tears noted. IV catheter discontinued intact. Site without signs and symptoms of complications. Dressing and pressure applied. Pt denies pain at the site currently. No complaints noted.  Patient free of lines, drains, and wounds.   An After Visit Summary (AVS) was printed and given to the patient. Patient escorted via wheelchair, and discharged home via private auto.  Lorine Bears, RN

## 2021-02-06 NOTE — Progress Notes (Signed)
CSW spoke with Ian Malkin of [employer] to request a wheelchair be delivered to the patient's room prior to discharge.  Edwin Dada, MSW, LCSW Transitions of Care   Clinical Social Worker II [phone]

## 2021-02-06 NOTE — Progress Notes (Cosign Needed)
°  °  Durable Medical Equipment  (From admission, onward)           Start     Ordered   02/06/21 0921  For home use only DME wheelchair cushion (seat and back)  Once        02/06/21 0920

## 2023-04-17 IMAGING — RF DG ABDOMEN 1V
1 series · 1 of 1 positions shown · non-contrast
Comparison: None.

CLINICAL DATA: Encounter for nasogastric tube placement.

EXAM:
ABDOMEN - 1 VIEW

[Series 1: cp_standard · 0.17mm/px · 1 of 1 slices shown]
[im 1/1]
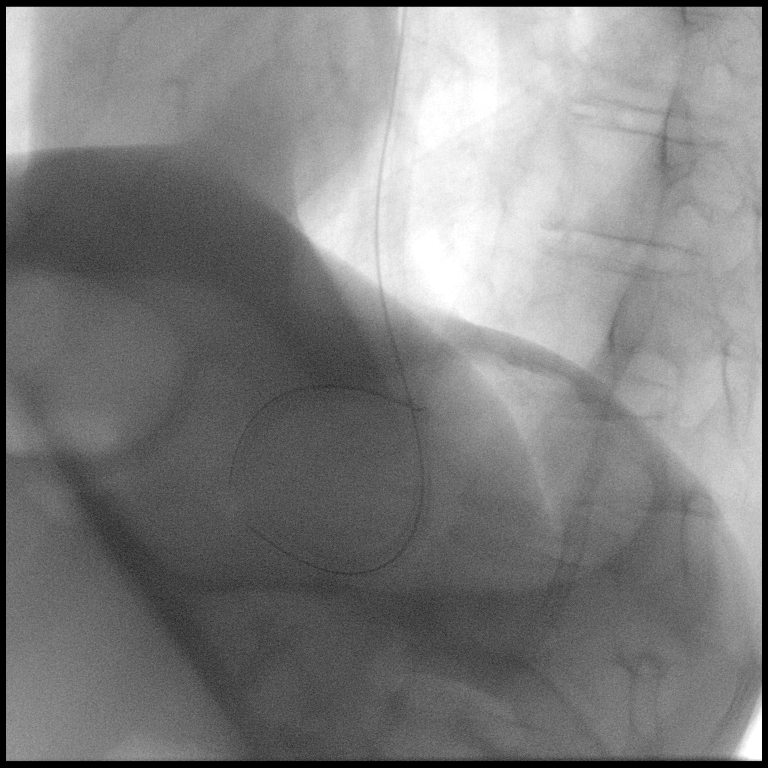

[1 of 1 positions shown; findings below may reference images not displayed]

FLUOROSCOPY TIME:  Fluoroscopy Time: 1 minute 12 seconds

Radiation Exposure Index: 1.70 mGy
FINDINGS: A single lateral fluoroscopic image is provided. An enteric tube is
looped in the upper abdomen in the expected location of the stomach
with side hole also below the diaphragm.
IMPRESSION: Enteric tube as above.

## 2024-02-18 ENCOUNTER — Ambulatory Visit
Admission: EM | Admit: 2024-02-18 | Discharge: 2024-02-18 | Disposition: A | Discharge status: Home / Self Care | Payer: MEDICAID | Source: Home / Self Care

## 2024-02-18 ENCOUNTER — Encounter: Payer: Self-pay | Admitting: Emergency Medicine

## 2024-02-18 DIAGNOSIS — R509 Fever, unspecified: Secondary | ICD-10-CM

## 2024-02-18 DIAGNOSIS — J069 Acute upper respiratory infection, unspecified: Secondary | ICD-10-CM | POA: Diagnosis not present

## 2024-02-18 DIAGNOSIS — Z711 Person with feared health complaint in whom no diagnosis is made: Secondary | ICD-10-CM | POA: Diagnosis not present

## 2024-02-18 LAB — POCT INFLUENZA A/B
Influenza A, POC: NEGATIVE
Influenza B, POC: NEGATIVE

## 2024-02-18 NOTE — ED Provider Notes (Signed)
 " [facility]    CSN: [id number] Arrival date & time: 02/18/24  1334      History   Chief Complaint Chief Complaint  Patient presents with   Fever    HPI Gary [facility] is a 23 y.o. male.   Pt presents today with caregiver, Sam. Pt was sent home from Day Program today due to subjective fever, highest temp 99.8. Sam denies that patient is coughing, sneezing, or experiencing change in appetite.   The history is provided by the patient.  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 Authorizing Provider  acetaminophen  (TYLENOL ) 325 MG tablet Take 1 tablet (325 mg total) by mouth every 6 (six) hours as needed for mild pain (or Fever >/= 101). 02/06/21  Yes Alto Isaiah CROME, NP  Multiple Vitamins-Minerals (CERTAVITE/ANTIOXIDANTS) TABS Take 1 tablet by mouth daily. 02/06/21  Yes Alto Isaiah CROME, NP  feeding supplement (ENSURE ENLIVE / ENSURE PLUS) LIQD Take 237 mLs by mouth 3 (three) times daily between meals. 02/06/21   Alto Isaiah CROME, NP  polyethylene glycol powder (GLYCOLAX /MIRALAX ) 17 GM/SCOOP powder Dissolve 1 capful (17 g) in water and drink daily. 02/06/21   Alto Isaiah CROME, NP    Family History Family History  Family history  unknown: Yes    Social History Social History[1]   Allergies   Vancomycin    Review of Systems Review of Systems  Constitutional:  Positive for fever.     Physical Exam Triage Vital Signs ED Triage Vitals  Encounter Vitals Group     BP 02/18/24 1541 111/75     Girls Systolic BP Percentile --      Girls Diastolic BP Percentile --      Boys Systolic BP Percentile --      Boys Diastolic BP Percentile --      Pulse Rate 02/18/24 1541 79     Resp 02/18/24 1541 20     Temp 02/18/24 1541 99.8 F (37.7 C)     Temp Source 02/18/24 1541 Oral     SpO2 02/18/24 1541 98 %     Weight 02/18/24 1539 85 lb (38.6 kg)     Height --      Head Circumference --      Peak Flow --      Pain Score --      Pain Loc --      Pain Education --      Exclude from Growth Chart --    No data found.  Updated Vital Signs BP 111/75 (BP Location: Left Arm)   Pulse 79   Temp 99.8 F (37.7 C) (Oral)   Resp 20   Wt 85  lb (38.6 kg)   SpO2 98%   BMI 20.49 kg/m   Visual Acuity Right Eye Distance:   Left Eye Distance:   Bilateral Distance:    Right Eye Near:   Left Eye Near:    Bilateral Near:     Physical Exam Vitals and nursing note reviewed.  Constitutional:      General: He is not in acute distress.    Appearance: Normal appearance. He is not ill-appearing, toxic-appearing or diaphoretic.  HENT:     Mouth/Throat:     Mouth: Mucous membranes are dry.  Eyes:     General: No scleral icterus. Cardiovascular:     Rate and Rhythm: Normal rate and regular rhythm.     Heart sounds: Normal heart sounds.  Pulmonary:     Effort: Pulmonary effort is normal. No respiratory distress.     Breath sounds: No wheezing or rhonchi.  Skin:    General: Skin is warm.  Neurological:     Mental Status: He is alert and oriented to person, place, and time.  Psychiatric:        Mood and Affect: Mood normal.        Behavior: Behavior normal.      UC Treatments / Results  Labs (all labs ordered  are listed, but only abnormal results are displayed) Labs Reviewed  POCT INFLUENZA A/B - Normal    EKG   Radiology No results found.  Procedures Procedures (including critical care time)  Medications Ordered in UC Medications - No data to display  Initial Impression / Assessment and Plan / UC Course  I have reviewed the triage vital signs and the nursing notes.  Pertinent labs & imaging results that were available during my care of the patient were reviewed by me and considered in my medical decision making (see chart for details).     Final Clinical Impressions(s) / UC Diagnoses   Final diagnoses:  Viral URI  Subjective fever  Worried well   Discharge Instructions   None    ED Prescriptions   None    PDMP not reviewed this encounter.    [1]  Social History Tobacco Use   Smoking status: Never    Passive exposure: Never   Smokeless tobacco: Never  Vaping Use   Vaping status: Never Used  Substance Use Topics   Alcohol use: No   Drug use: Never     Andra Corean BROCKS, PA-C 02/18/24 1622  "

## 2024-02-18 NOTE — ED Triage Notes (Signed)
 Pt presents with Sam, caretaker of the pt. Pt is c/o fever x today. Pt caretaker states,  His day program says he has a temp so they sent him home and asked that I bring him to [facility] for a check up   Pt denies any additional sxs.
